# Patient Record
Sex: Female | Born: 1950 | Race: White | Hispanic: No | Marital: Married | Smoking: Never smoker
Health system: Southern US, Community
[De-identification: ages and names within clinical notes are randomized; demographics above are authoritative.]

## PROBLEM LIST (undated history)

## (undated) DIAGNOSIS — N200 Calculus of kidney: Secondary | ICD-10-CM

## (undated) DIAGNOSIS — K219 Gastro-esophageal reflux disease without esophagitis: Secondary | ICD-10-CM

## (undated) DIAGNOSIS — C44729 Squamous cell carcinoma of skin of left lower limb, including hip: Secondary | ICD-10-CM

## (undated) DIAGNOSIS — G4730 Sleep apnea, unspecified: Secondary | ICD-10-CM

## (undated) DIAGNOSIS — R112 Nausea with vomiting, unspecified: Secondary | ICD-10-CM

## (undated) DIAGNOSIS — Z87442 Personal history of urinary calculi: Secondary | ICD-10-CM

## (undated) DIAGNOSIS — H269 Unspecified cataract: Secondary | ICD-10-CM

## (undated) DIAGNOSIS — E785 Hyperlipidemia, unspecified: Secondary | ICD-10-CM

## (undated) DIAGNOSIS — K760 Fatty (change of) liver, not elsewhere classified: Secondary | ICD-10-CM

## (undated) DIAGNOSIS — E119 Type 2 diabetes mellitus without complications: Secondary | ICD-10-CM

## (undated) DIAGNOSIS — I700 Atherosclerosis of aorta: Secondary | ICD-10-CM

## (undated) DIAGNOSIS — M1990 Unspecified osteoarthritis, unspecified site: Secondary | ICD-10-CM

## (undated) DIAGNOSIS — D4709 Other mast cell neoplasms of uncertain behavior: Secondary | ICD-10-CM

## (undated) DIAGNOSIS — T7840XA Allergy, unspecified, initial encounter: Secondary | ICD-10-CM

## (undated) DIAGNOSIS — Z8719 Personal history of other diseases of the digestive system: Secondary | ICD-10-CM

## (undated) DIAGNOSIS — K5790 Diverticulosis of intestine, part unspecified, without perforation or abscess without bleeding: Secondary | ICD-10-CM

## (undated) DIAGNOSIS — K635 Polyp of colon: Secondary | ICD-10-CM

## (undated) DIAGNOSIS — G709 Myoneural disorder, unspecified: Secondary | ICD-10-CM

## (undated) DIAGNOSIS — K5792 Diverticulitis of intestine, part unspecified, without perforation or abscess without bleeding: Secondary | ICD-10-CM

## (undated) DIAGNOSIS — E669 Obesity, unspecified: Secondary | ICD-10-CM

## (undated) DIAGNOSIS — Z9889 Other specified postprocedural states: Secondary | ICD-10-CM

## (undated) HISTORY — PX: BREAST BIOPSY: SHX20

## (undated) HISTORY — PX: TONSILLECTOMY: SHX5217

## (undated) HISTORY — PX: BLADDER SURGERY: SHX569

## (undated) HISTORY — DX: Obesity, unspecified: E66.9

## (undated) HISTORY — DX: Type 2 diabetes mellitus without complications: E11.9

## (undated) HISTORY — DX: Sleep apnea, unspecified: G47.30

## (undated) HISTORY — DX: Calculus of kidney: N20.0

## (undated) HISTORY — DX: Polyp of colon: K63.5

## (undated) HISTORY — DX: Gastro-esophageal reflux disease without esophagitis: K21.9

## (undated) HISTORY — DX: Unspecified osteoarthritis, unspecified site: M19.90

## (undated) HISTORY — PX: OTHER SURGICAL HISTORY: SHX169

## (undated) HISTORY — DX: Other mast cell neoplasms of uncertain behavior: D47.09

## (undated) HISTORY — DX: Personal history of urinary calculi: Z87.442

## (undated) HISTORY — DX: Allergy, unspecified, initial encounter: T78.40XA

## (undated) HISTORY — DX: Atherosclerosis of aorta: I70.0

## (undated) HISTORY — DX: Diverticulitis of intestine, part unspecified, without perforation or abscess without bleeding: K57.92

## (undated) HISTORY — DX: Personal history of other diseases of the digestive system: Z87.19

## (undated) HISTORY — DX: Unspecified cataract: H26.9

## (undated) HISTORY — DX: Hyperlipidemia, unspecified: E78.5

## (undated) HISTORY — DX: Diverticulosis of intestine, part unspecified, without perforation or abscess without bleeding: K57.90

## (undated) HISTORY — PX: DIAGNOSTIC LAPAROSCOPY: SUR761

## (undated) HISTORY — DX: Squamous cell carcinoma of skin of left lower limb, including hip: C44.729

## (undated) HISTORY — DX: Fatty (change of) liver, not elsewhere classified: K76.0

## (undated) HISTORY — PX: SALPINGOOPHORECTOMY: SHX82

---

## 1998-02-20 ENCOUNTER — Other Ambulatory Visit: Admission: RE | Admit: 1998-02-20 | Discharge: 1998-02-20 | Payer: Self-pay | Admitting: Obstetrics and Gynecology

## 1998-02-25 ENCOUNTER — Other Ambulatory Visit: Admission: RE | Admit: 1998-02-25 | Discharge: 1998-02-25 | Payer: Self-pay | Admitting: Gynecology

## 1998-05-20 ENCOUNTER — Inpatient Hospital Stay (HOSPITAL_COMMUNITY): Admission: RE | Admit: 1998-05-20 | Discharge: 1998-05-23 | Payer: Self-pay | Admitting: Gynecology

## 1998-05-20 HISTORY — PX: TOTAL ABDOMINAL HYSTERECTOMY W/ BILATERAL SALPINGOOPHORECTOMY: SHX83

## 1998-07-23 ENCOUNTER — Ambulatory Visit (HOSPITAL_BASED_OUTPATIENT_CLINIC_OR_DEPARTMENT_OTHER): Admission: RE | Admit: 1998-07-23 | Discharge: 1998-07-23 | Payer: Self-pay | Admitting: Orthopedic Surgery

## 1999-03-31 ENCOUNTER — Encounter: Admission: RE | Admit: 1999-03-31 | Discharge: 1999-06-29 | Payer: Self-pay | Admitting: Obstetrics and Gynecology

## 2001-03-15 ENCOUNTER — Encounter: Admission: RE | Admit: 2001-03-15 | Discharge: 2001-03-15 | Payer: Self-pay | Admitting: Obstetrics and Gynecology

## 2001-03-15 ENCOUNTER — Encounter: Payer: Self-pay | Admitting: Obstetrics and Gynecology

## 2002-06-21 ENCOUNTER — Encounter: Payer: Self-pay | Admitting: Obstetrics and Gynecology

## 2002-06-21 ENCOUNTER — Encounter: Admission: RE | Admit: 2002-06-21 | Discharge: 2002-06-21 | Payer: Self-pay | Admitting: Obstetrics and Gynecology

## 2003-09-27 ENCOUNTER — Encounter: Admission: RE | Admit: 2003-09-27 | Discharge: 2003-09-27 | Payer: Self-pay | Admitting: Obstetrics and Gynecology

## 2004-10-10 ENCOUNTER — Encounter: Admission: RE | Admit: 2004-10-10 | Discharge: 2004-10-10 | Payer: Self-pay | Admitting: Obstetrics and Gynecology

## 2005-12-29 ENCOUNTER — Emergency Department (HOSPITAL_COMMUNITY): Admission: EM | Admit: 2005-12-29 | Discharge: 2005-12-30 | Payer: Self-pay | Admitting: Emergency Medicine

## 2006-01-14 ENCOUNTER — Encounter: Admission: RE | Admit: 2006-01-14 | Discharge: 2006-01-14 | Payer: Self-pay | Admitting: Obstetrics and Gynecology

## 2006-02-09 ENCOUNTER — Ambulatory Visit (HOSPITAL_COMMUNITY): Admission: RE | Admit: 2006-02-09 | Discharge: 2006-02-09 | Payer: Self-pay | Admitting: Cardiovascular Disease

## 2007-01-27 ENCOUNTER — Encounter: Admission: RE | Admit: 2007-01-27 | Discharge: 2007-01-27 | Payer: Self-pay | Admitting: Obstetrics and Gynecology

## 2008-01-31 ENCOUNTER — Encounter: Admission: RE | Admit: 2008-01-31 | Discharge: 2008-01-31 | Payer: Self-pay | Admitting: Obstetrics and Gynecology

## 2009-02-12 ENCOUNTER — Encounter: Admission: RE | Admit: 2009-02-12 | Discharge: 2009-02-12 | Payer: Self-pay | Admitting: Obstetrics and Gynecology

## 2009-02-22 ENCOUNTER — Ambulatory Visit (HOSPITAL_COMMUNITY): Admission: RE | Admit: 2009-02-22 | Discharge: 2009-02-22 | Payer: Self-pay | Admitting: Obstetrics and Gynecology

## 2009-12-10 ENCOUNTER — Encounter: Admission: RE | Admit: 2009-12-10 | Discharge: 2009-12-10 | Payer: Self-pay | Admitting: Endocrinology

## 2010-02-26 ENCOUNTER — Encounter: Admission: RE | Admit: 2010-02-26 | Discharge: 2010-02-26 | Payer: Self-pay | Admitting: Obstetrics and Gynecology

## 2010-05-14 ENCOUNTER — Encounter: Admission: RE | Admit: 2010-05-14 | Discharge: 2010-05-14 | Payer: Self-pay | Admitting: Gastroenterology

## 2010-09-03 ENCOUNTER — Encounter: Admission: RE | Admit: 2010-09-03 | Discharge: 2010-09-03 | Payer: Self-pay | Admitting: Gastroenterology

## 2010-10-26 ENCOUNTER — Encounter: Payer: Self-pay | Admitting: Endocrinology

## 2011-04-17 ENCOUNTER — Other Ambulatory Visit: Payer: Self-pay | Admitting: Obstetrics and Gynecology

## 2011-04-17 DIAGNOSIS — Z1231 Encounter for screening mammogram for malignant neoplasm of breast: Secondary | ICD-10-CM

## 2011-04-29 ENCOUNTER — Other Ambulatory Visit: Payer: Self-pay | Admitting: Endocrinology

## 2011-04-29 DIAGNOSIS — R599 Enlarged lymph nodes, unspecified: Secondary | ICD-10-CM

## 2011-04-30 ENCOUNTER — Other Ambulatory Visit: Payer: Self-pay

## 2011-05-05 ENCOUNTER — Ambulatory Visit: Payer: Self-pay

## 2011-05-06 ENCOUNTER — Ambulatory Visit
Admission: RE | Admit: 2011-05-06 | Discharge: 2011-05-06 | Disposition: A | Discharge status: Home / Self Care | Payer: PRIVATE HEALTH INSURANCE | Source: Ambulatory Visit | Attending: Endocrinology | Admitting: Endocrinology

## 2011-05-06 DIAGNOSIS — R599 Enlarged lymph nodes, unspecified: Secondary | ICD-10-CM

## 2013-06-29 ENCOUNTER — Encounter: Payer: Self-pay | Admitting: Cardiovascular Disease

## 2013-10-05 DIAGNOSIS — C44729 Squamous cell carcinoma of skin of left lower limb, including hip: Secondary | ICD-10-CM

## 2013-10-05 HISTORY — DX: Squamous cell carcinoma of skin of left lower limb, including hip: C44.729

## 2014-04-11 ENCOUNTER — Other Ambulatory Visit: Payer: Self-pay | Admitting: Obstetrics & Gynecology

## 2014-04-11 ENCOUNTER — Other Ambulatory Visit: Payer: Self-pay | Admitting: Obstetrics and Gynecology

## 2014-04-11 DIAGNOSIS — R1084 Generalized abdominal pain: Secondary | ICD-10-CM

## 2014-04-13 ENCOUNTER — Ambulatory Visit
Admission: RE | Admit: 2014-04-13 | Discharge: 2014-04-13 | Disposition: A | Discharge status: Home / Self Care | Payer: BC Managed Care – PPO | Source: Ambulatory Visit | Attending: Obstetrics & Gynecology | Admitting: Obstetrics & Gynecology

## 2014-04-13 DIAGNOSIS — R1084 Generalized abdominal pain: Secondary | ICD-10-CM

## 2014-04-13 MED ORDER — IOHEXOL 300 MG/ML  SOLN
100.0000 mL | Freq: Once | INTRAMUSCULAR | Status: AC | PRN
  Administered 2014-04-13: 100 mL via INTRAVENOUS

## 2014-05-07 ENCOUNTER — Other Ambulatory Visit: Payer: Self-pay | Admitting: Obstetrics and Gynecology

## 2014-07-12 ENCOUNTER — Telehealth: Payer: Self-pay | Admitting: Obstetrics and Gynecology

## 2014-07-12 NOTE — Telephone Encounter (Signed)
Call to pt to verify appt no answer

## 2014-07-18 ENCOUNTER — Ambulatory Visit (INDEPENDENT_AMBULATORY_CARE_PROVIDER_SITE_OTHER): Payer: BC Managed Care – PPO | Admitting: Obstetrics and Gynecology

## 2014-07-18 ENCOUNTER — Encounter: Payer: Self-pay | Admitting: Obstetrics and Gynecology

## 2014-07-18 VITALS — BP 120/66 | HR 68 | Resp 16 | Ht 65.0 in

## 2014-07-18 DIAGNOSIS — N393 Stress incontinence (female) (male): Secondary | ICD-10-CM

## 2014-07-18 DIAGNOSIS — Q822 Mastocytosis: Secondary | ICD-10-CM

## 2014-07-18 DIAGNOSIS — D4709 Other mast cell neoplasms of uncertain behavior: Secondary | ICD-10-CM

## 2014-07-18 DIAGNOSIS — Z01419 Encounter for gynecological examination (general) (routine) without abnormal findings: Secondary | ICD-10-CM

## 2014-07-18 DIAGNOSIS — N816 Rectocele: Secondary | ICD-10-CM

## 2014-07-18 MED ORDER — NYSTATIN-TRIAMCINOLONE 100000-0.1 UNIT/GM-% EX CREA: 1.0000 "application " | TOPICAL_CREAM | Freq: Two times a day (BID) | CUTANEOUS | Status: DC

## 2014-07-18 NOTE — Patient Instructions (Signed)

## 2014-07-18 NOTE — Progress Notes (Signed)
Patient ID: Kelly Glover, female   DOB: [birth year], 63 y.o.   MRN: [id number] 63 y.o. [id number] MarriedCaucasianF here for annual exam.   Has a rectocele.  If bladder is full and laughs, leaks urine.  Difficulty controlling gas with laughing.  No constipation.  No fecal soiling.  Ready for treatment.  Considering surgery for this year.   Has chronic yeast infections with diabetes.  Uses Monistat and Nystatin.  Takes Probiotics.  Status post TAH/USO.  Had pain and heavy menses.  Thinks she may have had an additional tightening procedure at the time of her surgery.  History of ectopic pregnancy and had tube and ovary removed.   On Estradiol 2.0 mg, takes one half tablet (1 mg daily). Has tried to wean off.  Did not take it today.  Used patch in the past and did not like it.   CT of abdomen and pelvis with contrast 04/13/14 - LLQ pain with bloating and cramping -  no acute findings, atherosclerotic disease, renal left renal calculi, multiple distal colonic diverticula.  Had an MRI. Subsequently diagnosed with diverticulitis.  Treated with antibiotics.  Has Augmentin and takes prn.   Diabetes mellitus.  Has an insulin pump and a transmitter also. Sees Dr. Forde Dandy.  No complications. No MI or stroke.   Has inappropriate mast cell activation syndrome.  Dr. Roswell Nickel. Episodes of sweating, diarrhea, vomiting, and hypotension that happen at night.  Treated with antihistamines. Last attack was 2 - 3 years ago.   No LMP recorded. Patient has had a hysterectomy.          Sexually active: Yes.    The current method of family planning is status post hysterectomy.    Exercising: Yes.    Gym/ health club routine includes personal trainer two times per week. Smoker:  no  Health Maintenance: Pap:  ?  History of abnormal Pap:  no MMG:  12/2013, normal per patient Colonoscopy:  2013, Dr. Earlean Shawl, normal, repeat in 5 years BMD:   02/26/10, T-Score: -0.9Spine/-1.6Left hip - osteopenia. Prompted by arm  and ankle fracture.   TDaP:  ?2012 Screening Labs: PCP, has copies with her today.  Glucose 112, WBC 11.5   reports that she has never smoked. She has never used smokeless tobacco. She reports that she does not drink alcohol or use illicit drugs.  Past Medical History  Diagnosis Date  . Diabetes   . History of kidney stones   . History of diverticulitis   . 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 Total abdominal hysterectomy w/ bilateral salpingoophorectomy  05/20/98  . Breast biopsy Left ?    benign  . Tonsillectomy  childhood    Current Outpatient Prescriptions  Medication Sig Dispense Refill  . aspirin EC 81 MG tablet Take 81 mg by mouth daily.      . cetirizine (ZYRTEC) 10 MG tablet Take 10 mg by mouth daily.      . Cholecalciferol (VITAMIN D) 2000 UNITS CAPS Take 1 capsule by mouth daily.      . CRESTOR 20 MG tablet Take 1 tablet by mouth daily.      Marland Kitchen estradiol (ESTRACE) 2 MG tablet Take 1 tablet by mouth daily.      . famotidine (PEPCID AC) 10 MG chewable tablet Chew 10 mg by mouth 2 (two) times daily.      . hyoscyamine (LEVSIN, ANASPAZ) 0.125 MG tablet Take 1 tablet  by mouth as needed.      Marland Kitchen JARDIANCE 10 MG TABS Take 1 tablet by mouth daily.      . metoprolol succinate (TOPROL-XL) 25 MG 24 hr tablet Take 1 tablet by mouth daily.      Marland Kitchen NOVOLOG 100 UNIT/ML injection Inject into the skin as directed.      . nystatin-triamcinolone (MYCOLOG II) cream Apply 1 application topically 2 (two) times daily.      Marland Kitchen omeprazole-sodium bicarbonate (ZEGERID) 40-1100 MG per capsule Take 1 capsule by mouth daily before breakfast.      . ONE TOUCH ULTRA TEST test strip       . ramipril (ALTACE) 2.5 MG capsule Take 1 capsule by mouth daily.      . sertraline (ZOLOFT) 100 MG tablet Take 1 tablet by mouth daily.      Marland Kitchen ZETIA 10 MG tablet Take 1 tablet by mouth daily.       No current facility-administered medications for this  visit.    Family History  Problem Relation Age of Onset  . Diabetes Mother   . Heart disease Mother   . Heart disease Father   . Thyroid disease Sister   . Asthma Maternal Grandmother 75  . Diabetes Maternal Grandmother   . Diabetes Maternal Grandfather   . Thyroid disease Son     Grave's Disease    ROS:  Pertinent items are noted in HPI.  Otherwise, a comprehensive ROS was negative.  Exam:   BP 120/66  Pulse 68  Resp 16  Ht 5' 5"  (1.651 m)     Height: 5' 5"  (165.1 cm)  Ht Readings from Last 3 Encounters:  07/18/14 5' 5"  (1.651 m)    General appearance: alert, cooperative and appears stated age Head: Normocephalic, without obvious abnormality, atraumatic Neck: no adenopathy, supple, symmetrical, trachea midline and thyroid normal to inspection and palpation Lungs: clear to auscultation bilaterally Breasts: normal appearance, no masses or tenderness, Inspection negative, No nipple retraction or dimpling, No nipple discharge or bleeding, No axillary or supraclavicular adenopathy Heart: regular rate and rhythm Abdomen: Insulin pump attached to skin, soft, non-tender; bowel sounds normal; no masses,  no organomegaly Extremities: extremities normal, atraumatic, no cyanosis or edema Skin: Skin color, texture, turgor normal. No rashes or lesions Lymph nodes: Cervical, supraclavicular, and axillary nodes normal. No abnormal inguinal nodes palpated Neurologic: Grossly normal   Pelvic: External genitalia:  no lesions.  Split in the akin of the perineum.              Urethra:  normal appearing urethra with no masses, tenderness or lesions              Bartholins and Skenes: normal                 Vagina: normal appearing vagina with normal color and discharge, no lesions, good anterior vaginal support with minimal prolapse, good apical support, second degree rectocele.              Cervix: anteverted              Pap taken: No. Bimanual Exam:  Uterus:  uterus absent               Adnexa: normal adnexa and no mass, fullness, tenderness               Rectovaginal: Confirms               Anus:  normal sphincter tone,  no lesions  Assessment: Well Woman Visit. Status post TAH/USO?[place] versus anterior colporrhaphy.  Status post prior USO for ectopic pregnancy.  Rectocele.  Incontinence of flatus. Stress incontinence by history.  ERT patient.  Recent diverticulitis.  Diabetes.   Chronic yeast infections.  Inappropriate mast cell activation syndrome.  Plan: Mammogram yearly.  Pap smear not indicated. Discussed ERT and potential benefits of using transdermal patch to reduce risk of thromboembolic events.  She states she has some patches at home and may try one. Rx for Mycolog II cream.  Comprehensive discussion about pelvic organ prolapse and genuine stress incontinence.  ACOG handouts on prolapse and incontinence in general and surgical treatment thereof. I discussed potential midurethral sling with permanent mesh and cystoscopy and posterior colporrhapy using native tissue repair.   We discussed benefits and risks of surgery which include but are not limited to bleeding, infection, damage to surrounding organs, ureteral damage, vaginal pain with intercourse, mesh erosion and exposure, urinary retention and need for prolonged catheterization and/or self catheterization, reoperation, recurrence of prolapse and incontinence. I have discussed surgical expectations regarding the procedures and success rates, outcomes, and recovery.     The patient will obtain records from her prior hysterectomy surgery.  She understands that she may need urodynamic testing performed in preparation for surgery.  She is uncertain at this time if she would like to proceed with a midurethral sling but is interested in the posterior colporrhaphy.  return annually or prn  An After Visit Summary was printed and given to the patient.  One hour face to face time of which over 50% was spent in  counseling.

## 2014-07-18 NOTE — Telephone Encounter (Signed)
Pt call to let sally know that 09/17/14 is okay for surgery.

## 2014-07-18 NOTE — Telephone Encounter (Signed)
Return call to patient. Patient talked with her husband and wants to proceed with rectocele repair on 09-17-14. Does not want to graft but states her husband (colleague of Dr Quincy Simmonds) with speak to Dr Quincy Simmonds personally and discuss options. He will also attend surgery consult appointment.  Advised patient we would like to get records from hysterectomy sent to our office. She will check on this. She will check her schedule and coordinate with husband's schedule and call back to proceed with scheduling of pre and post op appointments. Surgery is scheduled for 09-17-14 at 1100 at [facility]. Hospital notified patient will need pre anesthesia consult due to diabetes on surgical request. Chart to insurance to check surgical benefits.

## 2014-07-19 ENCOUNTER — Encounter: Payer: Self-pay | Admitting: Obstetrics and Gynecology

## 2014-07-19 DIAGNOSIS — N816 Rectocele: Secondary | ICD-10-CM | POA: Insufficient documentation

## 2014-07-19 DIAGNOSIS — N393 Stress incontinence (female) (male): Secondary | ICD-10-CM | POA: Insufficient documentation

## 2014-07-19 DIAGNOSIS — D4709 Other mast cell neoplasms of uncertain behavior: Secondary | ICD-10-CM | POA: Insufficient documentation

## 2014-07-20 ENCOUNTER — Telehealth: Payer: Self-pay | Admitting: Obstetrics and Gynecology

## 2014-07-20 NOTE — Telephone Encounter (Signed)
Phone call in follow up to office visit. Her husband will bring her operative report from her hysterectomy to the office here.  She is not interested in doing a midurethral sling.  Will await records from prior procedure and then make final with patient and then with husband conferring as well. Patient in agreement.

## 2014-08-03 ENCOUNTER — Ambulatory Visit (INDEPENDENT_AMBULATORY_CARE_PROVIDER_SITE_OTHER): Payer: BC Managed Care – PPO | Admitting: Internal Medicine

## 2014-08-03 ENCOUNTER — Encounter: Payer: Self-pay | Admitting: *Deleted

## 2014-08-03 VITALS — BP 124/68 | HR 50 | Ht 65.0 in

## 2014-08-03 DIAGNOSIS — E785 Hyperlipidemia, unspecified: Secondary | ICD-10-CM

## 2014-08-03 NOTE — Patient Instructions (Signed)
Your physician wants you to follow-up in: 1 year with Dr. Hattery.  You will receive a reminder letter in the mail two months in advance. If you don't receive a letter, please call our office to schedule the follow-up appointment.  

## 2014-08-03 NOTE — Telephone Encounter (Signed)
See next phone encounter from dr Quincy Simmonds regarding plan for surgery. [place] with insurance and billing has discussed insurance benefits with patient.  Routing to provider for final review. Patient agreeable to disposition. Will close encounter

## 2014-08-03 NOTE — Progress Notes (Signed)
HPI Kelly Glover is a 63 yo who was previously followed by Kelly Glover She has a history of DM, hyperlipidemia.  CT scan in past showed mild atherosclerotic plaquing of aorta. She has had intermittent chest pains in past.  Occasional palpitations  Short lived  Has had stress tests  (myoview 2013, neg), echo, and event monitors that have been negative.  She comes in today for continued cardiac care.  She follows with Kelly Glover  She notes rare CP spells  Not associated with actvitiy. Following her glucose more closely   Having problems with her L arm (torn biceps tendon) Allergies  Allergen Reactions  . Diflucan [Fluconazole]     Itching, palms red, throat itchy.   . Griseofulvin   . Doxycycline Itching and Rash    Itchy throat    Current Outpatient Prescriptions  Medication Sig Dispense Refill  . aspirin EC 81 MG tablet Take 81 mg by mouth daily.      . cetirizine (ZYRTEC) 10 MG tablet Take 10 mg by mouth daily.      . Cholecalciferol (VITAMIN D) 2000 UNITS CAPS Take 1 capsule by mouth daily.      . CRESTOR 20 MG tablet Take 1 tablet by mouth daily.      . famotidine (PEPCID AC) 10 MG chewable tablet Chew 10 mg by mouth 2 (two) times daily.      Marland Kitchen FOLIC ACID PO Take by mouth daily.      Marland Kitchen JARDIANCE 10 MG TABS Take 1 tablet by mouth daily.      . metoprolol succinate (TOPROL-XL) 25 MG 24 hr tablet Take 1 tablet by mouth as needed.       Marland Kitchen NOVOLOG 100 UNIT/ML injection Inject into the skin as directed.      Marland Kitchen omeprazole-sodium bicarbonate (ZEGERID) 40-1100 MG per capsule Take 1 capsule by mouth daily before breakfast.      . ONE TOUCH ULTRA TEST test strip       . ramipril (ALTACE) 2.5 MG capsule Take 1 capsule by mouth daily.      . sertraline (ZOLOFT) 100 MG tablet Take 1 tablet by mouth daily.      Marland Kitchen ZETIA 10 MG tablet Take 1 tablet by mouth daily.       No current facility-administered medications for this visit.    Past Medical History  Diagnosis Date  . Diabetes   . History of  kidney stones   . History of diverticulitis   . 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 Total abdominal hysterectomy w/ bilateral salpingoophorectomy  05/20/98  . Breast biopsy Left ?    benign  . Tonsillectomy  childhood    Family History  Problem Relation Age of Onset  . Diabetes Mother   . Heart disease Mother   . Heart disease Father   . Thyroid disease Sister   . Asthma Maternal Grandmother 66  . Diabetes Maternal Grandmother   . Diabetes Maternal Grandfather   . Graves' disease Son   . Breast cancer Maternal Grandmother   . Thyroid disease Son     History   Social History  . Marital Status: Married    Spouse Name: N/A    Number of Children: N/A  . Years of Education: N/A   Occupational History  . Not on file.   Social History Main  Topics  . Smoking status: Never Smoker   . Smokeless tobacco: Never Used  . Alcohol Use: No  . Drug Use: No  . Sexual Activity: Yes    Partners: Male    Birth Control/ Protection: Surgical     Comment: hysterectomy   Other Topics Concern  . Not on file   Social History Narrative  . No narrative on file    Review of Systems:  All systems reviewed.  They are negative to the above problem except as previously stated.  Vital Signs: BP 124/68  Pulse 50  Ht 5' 5"  (1.651 m)  Physical Exam   HEENT:  Normocephalic, atraumatic. EOMI, PERRLA.  Neck: JVP is normal.  No bruits.  Lungs: clear to auscultation. No rales no wheezes.  Heart: Regular rate and rhythm. Normal S1, S2. No S3.   No significant murmurs. PMI not displaced.  Abdomen:  Supple, nontender. Normal bowel sounds. No masses. No hepatomegaly.  Extremities:   Good distal pulses throughout. No lower extremity edema.  Musculoskeletal :moving all extremities.  Neuro:   alert and oriented x3.  CN II-XII grossly intact.  EKG   SB 50     Assessment and Plan:  1.  HL  Last lipids LDL was 55, HDL 69  Trig 83  Says her LDL signif improved when she added Zetia  Would continue Watch diet. Try to lose wt.   Stay on this dose with mild plaquing of aorta.

## 2014-08-03 NOTE — Telephone Encounter (Signed)
OKay to close?

## 2014-08-06 ENCOUNTER — Encounter: Payer: Self-pay | Admitting: *Deleted

## 2014-08-07 NOTE — Telephone Encounter (Signed)
Left message for patient to call back. Need to go over benefits for urodynamics and surgery. Urodynamics =$40 copay Surgeon=$200 copay

## 2014-08-08 ENCOUNTER — Encounter: Payer: Self-pay | Admitting: Internal Medicine

## 2014-08-08 ENCOUNTER — Encounter: Payer: Self-pay | Admitting: Cardiovascular Disease

## 2014-08-10 ENCOUNTER — Telehealth: Payer: Self-pay | Admitting: Emergency Medicine

## 2014-08-10 ENCOUNTER — Telehealth: Payer: Self-pay | Admitting: Obstetrics and Gynecology

## 2014-08-10 NOTE — Telephone Encounter (Signed)
Encounter opened erroneously.   Closed encounter.

## 2014-08-10 NOTE — Telephone Encounter (Signed)
Phone call to discuss bladder care and potential bladder surgery at the time of the rectocele repair.  Patient has had a prior anterior colporrhaphy in 1999 when she had an LAVH/BSO/LOA.  Patient has leakage of urine if bladder is full and she coughs, laughs, or sneezes. She is uncertain if she wishes to proceed with a midurethral sling.   I discussed and recommended urodynamic testing to further evaluate her stress incontinence.  Patient agrees with this plan.   Will precert urodynamics and add possible TVT and cystoscopy to her surgery for December 2015.

## 2014-08-10 NOTE — Telephone Encounter (Signed)
Spoke with patient. Advised that per benefit quote received she will be responsible for a $40 copay when she comes in for urodynamics. Also advised that there will be associated pathology charges, but that because they are billed by the lab. I am unable to quote those costs. Patient agreeable. Scheduled urinalysis 11.13 and urodynamics 11.18.  Also advised patient that per benefit quote received, she will be responsible for a $200 copay for the surgeons portion of her surgery. Advised that she will receive separate correspondence from the hospital regarding theirs fees/billing. Patient agreeable.    Gay Filler, Please enter urodynamics order.  Thanks

## 2014-08-20 ENCOUNTER — Ambulatory Visit (INDEPENDENT_AMBULATORY_CARE_PROVIDER_SITE_OTHER): Payer: BC Managed Care – PPO

## 2014-08-20 ENCOUNTER — Telehealth: Payer: Self-pay | Admitting: Obstetrics and Gynecology

## 2014-08-20 VITALS — BP 132/70 | HR 72

## 2014-08-20 DIAGNOSIS — N393 Stress incontinence (female) (male): Secondary | ICD-10-CM

## 2014-08-20 LAB — POCT URINALYSIS DIPSTICK
Bilirubin, UA: NEGATIVE
KETONES UA: NEGATIVE
Leukocytes, UA: NEGATIVE
Nitrite, UA: NEGATIVE
Protein, UA: NEGATIVE
RBC UA: NEGATIVE
UROBILINOGEN UA: NEGATIVE
pH, UA: 6

## 2014-08-20 NOTE — Progress Notes (Signed)
Pt came here for UA dipstick before her urodynamics Appt. Pt refused to have weight and height taken. The pt states no symptoms of any kind. The UA showed 1014m of glucose with a ph of 6.0. Routing to provider for final review.

## 2014-08-20 NOTE — Telephone Encounter (Signed)
MAILED SURGERY PAYMENT LETTER  August 20, 2014   Dear Mrs. Kelly Glover,  Your surgery is scheduled for September 17, 2014.  Upon requesting authorization for your surgery, your insurance company has informed us that they will cover 100% of the charges after a $200.00 copay, and you will be responsible to pay approximately $200.00  It is our office policy that this amount is paid in full two weeks prior to your surgery. Your payment is due September 03, 2014.  If there is a balance due after your insurance company pays their portion, we will send you a bill.  If there is a refund due to you, we will send you a check within one month.  Payment may be made by cash, check, Visa, MasterCard or [employer] Express.  If payment is not made two weeks prior to your surgery, we will have to reschedule your surgery.  The above fee includes only our fee for the surgery and does not include charges you may have from the facility, anesthesia or pathology.  If you have any questions, please call us at [phone].

## 2014-08-21 NOTE — Progress Notes (Signed)
Encounter reviewed by Dr. Josefa Half.  OK to proceed with urodynamic testing.

## 2014-08-22 ENCOUNTER — Ambulatory Visit (INDEPENDENT_AMBULATORY_CARE_PROVIDER_SITE_OTHER): Payer: BC Managed Care – PPO | Admitting: Obstetrics and Gynecology

## 2014-08-22 VITALS — BP 133/78 | HR 76 | Resp 12

## 2014-08-22 DIAGNOSIS — N816 Rectocele: Secondary | ICD-10-CM

## 2014-08-22 DIAGNOSIS — N393 Stress incontinence (female) (male): Secondary | ICD-10-CM

## 2014-08-22 NOTE — Telephone Encounter (Signed)
Patient is office today for urodynamic testing. Surgery date of 09-17-14 at 0730 confirmed. Surgery instruction sheet reviewed and printed copy given to patient. Pre and post-surgical appointments scheduled.Consult to discuss results urodynamics results scheduled with surgical consult for 09-05-14. She is advised to discontinue ASA and NSAIDS 2 weeks prior to surgery. Fleet enema evening before surgery unless otherwise directed by Dr Quincy Simmonds at surgery consult.  Anything else needed before surgery consult?

## 2014-08-22 NOTE — Telephone Encounter (Signed)
Patient will need anesthesia consultation.  She has diabetes and uses an insulin pump.   Was recently seen by her cardiologist, so I do not think that that is necessary as another preop visit.

## 2014-08-22 NOTE — Patient Instructions (Signed)
.   You may have a mild bladder and rectal discomfort for a few hours after the test. . You may experience some frequent urination and slight burning the first few times you urinate after the test. Rarely, the urine may be blood tinged. These are both due to catheter placements and resolve quickly.  . You should call our office immediately if you have signs of infection, which may include bladder pain, urinary urgency, fever, or burning during urination. .  We do encourage you to drink plenty of water after completion of the test today.

## 2014-08-23 NOTE — Telephone Encounter (Signed)
Anesthesia consult was requested when case was submitted. Call to [facility] PAT dept, spoke to Arbie Cookey to confirm patient would have anesthesia consult at pre-op appointment.  Encounter closed.

## 2014-08-23 NOTE — Progress Notes (Signed)
Late Entry for 08/22/14 1100:   Kelly Glover is a 63 y.o. female Who presents today for urodynamics testings. Allergies and medications reviewed. Verbal consent obtained. Denies complaints today. No urinary complaints.  Urine Micro exam: negative for WBC's or RBC's, okay to proceed per Dr. Quincy Simmonds. Patient reports urinary leakage with coughing, sneezing, exercise, and heavy lifting. Also feels she leaks with very full bladder.  Post void residual 0 ml.  Urodynamics testing initiated with urethral and  Rectal catheter placed without issue.  Urodynamics testing completed. Please see scanned Patient summary report in Epic. Procedure completed and patient tolerated well without complaints.   Patient given post procedure instructions:  You may have a mild bladder and rectal discomfort for a few hours after the test. You may experience some frequent urination and slight burning the first few times you urinate after the test. Rarely, the urine may be blood tinged. These are both due to catheter placements and resolve quickly. You should call our office immediately if you have signs of infection, which may include bladder pain, urinary urgency, fever, or burning during urination. We do encourage you to drink plenty of water after the test. Patient verbalized understanding of instructions and follow up scheduled.

## 2014-08-24 ENCOUNTER — Encounter: Payer: Self-pay | Admitting: Obstetrics and Gynecology

## 2014-08-24 NOTE — Progress Notes (Signed)
Encounter reviewed by Dr. Josefa Half.  Multichannel urodynamic testing   Uroflow - Void 58 cc.  PVR 0 CMG - S1 - 444cc, S2 616 cc, Max capacity 745.7 cc.  Stable CMG.  No LPP.  UPP - 26 cm H2O. Pressure flow - Pdet max 40 cm H2O.  Voided 805 cc.  No evidence of genuine stress incontinence.  Large bladder volume.

## 2014-09-05 ENCOUNTER — Ambulatory Visit (INDEPENDENT_AMBULATORY_CARE_PROVIDER_SITE_OTHER): Payer: BC Managed Care – PPO | Admitting: Obstetrics and Gynecology

## 2014-09-05 ENCOUNTER — Encounter: Payer: Self-pay | Admitting: Obstetrics and Gynecology

## 2014-09-05 ENCOUNTER — Telehealth: Payer: Self-pay | Admitting: *Deleted

## 2014-09-05 VITALS — BP 140/60 | HR 72 | Temp 98.7°F | Resp 18 | Ht 65.0 in

## 2014-09-05 DIAGNOSIS — N816 Rectocele: Secondary | ICD-10-CM

## 2014-09-05 MED ORDER — OXYCODONE HCL 5 MG PO TABS: 5.0000 mg | ORAL_TABLET | ORAL | Status: DC | PRN

## 2014-09-05 MED ORDER — TERCONAZOLE 0.4 % VA CREA: 1.0000 | TOPICAL_CREAM | Freq: Every day | VAGINAL | Status: DC

## 2014-09-05 MED ORDER — IBUPROFEN 800 MG PO TABS: 800.0000 mg | ORAL_TABLET | Freq: Three times a day (TID) | ORAL | Status: DC | PRN

## 2014-09-05 MED ORDER — ONDANSETRON HCL 4 MG PO TABS: 4.0000 mg | ORAL_TABLET | Freq: Three times a day (TID) | ORAL | Status: DC | PRN

## 2014-09-05 NOTE — Patient Instructions (Signed)
Please do an enema the night before surgery.

## 2014-09-05 NOTE — Progress Notes (Signed)
GYNECOLOGY  VISIT   HPI: 63 y.o.   Married  Caucasian  female   [phone] with No LMP recorded. Patient has had a hysterectomy.   here for Pre Op for rectocele repair.  Patient's husband, Dr. Gus Height, is with patient today for the discussion portion of her visit.   Has a symptomatic rectocele and loss of control of gas when she laughs. Rare leakage of urine with valsalva type maneuver.  Had urodynamic testing and did not demonstrate genuine stress incontinence.  Patient wishing to avoid midurethral sling.  Is status post anterior colporrhaphy at time of her laparoscopically assisted vaginal hysterectomy.   No change in medical status.  Has an insulin pump and a glucose recorder.  Deals with chronic vulvar candida. Does monthly Gynezole-1.  Saw Dr. Dorris Carnes for routine cardiology visit.  No changes in her care were made.  She is starting a weight loss program.  GYNECOLOGIC HISTORY: No LMP recorded. Patient has had a hysterectomy. Contraception: Hysterectomy   Menopausal hormone therapy: occ Estradiol        OB History    Gravida Para Term Preterm AB TAB SAB Ectopic Multiple Living   4 2 2  2  1 1  2          Patient Active Problem List   Diagnosis Date Noted  . Rectocele 07/19/2014  . Genuine stress incontinence, female 07/19/2014  . Mast cell disorder     Past Medical History  Diagnosis Date  . Diabetes   . History of kidney stones   . History of diverticulitis   . 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 Total abdominal hysterectomy w/ bilateral salpingoophorectomy  05/20/98  . Breast biopsy Left ?    benign  . Tonsillectomy  childhood    Current Outpatient Prescriptions  Medication Sig Dispense Refill  . aspirin EC 81 MG tablet Take 81 mg by mouth daily.    . cetirizine (ZYRTEC) 10 MG tablet Take 10 mg by mouth daily.    .  Cholecalciferol (VITAMIN D) 2000 UNITS CAPS Take 1 capsule by mouth daily.    . CRESTOR 20 MG tablet Take 1 tablet by mouth daily.    Marland Kitchen estradiol (ESTRACE) 2 MG tablet   0  . famotidine (PEPCID AC) 10 MG chewable tablet Chew 10 mg by mouth daily.     Marland Kitchen FOLIC ACID PO Take 726 mcg by mouth daily.     Marland Kitchen JARDIANCE 10 MG TABS Take 1 tablet by mouth daily.    Marland Kitchen NOVOLOG 100 UNIT/ML injection Inject into the skin as directed. Insulin pump    . nystatin-triamcinolone (MYCOLOG II) cream Apply topically 2 (two) times daily.  0  . omeprazole-sodium bicarbonate (ZEGERID) 40-1100 MG per capsule Take 1 capsule by mouth daily before breakfast.    . ONE TOUCH ULTRA TEST test strip     . ramipril (ALTACE) 2.5 MG capsule Take 1 capsule by mouth daily.    . sertraline (ZOLOFT) 100 MG tablet Take 1 tablet by mouth daily.    Marland Kitchen ZETIA 10 MG tablet Take 1 tablet by mouth daily.     No current facility-administered medications for this visit.     ALLERGIES: Diflucan; Griseofulvin; and Doxycycline  Family History  Problem Relation Age of Onset  . Diabetes Mother   . Heart disease Mother   .  Heart disease Father   . Thyroid disease Sister   . Asthma Maternal Grandmother 29  . Diabetes Maternal Grandmother   . Diabetes Maternal Grandfather   . Graves' disease Son   . Breast cancer Maternal Grandmother   . Thyroid disease Son     History   Social History  . Marital Status: Married    Spouse Name: N/A    Number of Children: N/A  . Years of Education: N/A   Occupational History  . Not on file.   Social History Main Topics  . Smoking status: Never Smoker   . Smokeless tobacco: Never Used  . Alcohol Use: No  . Drug Use: No  . Sexual Activity:    Partners: Male    Birth Control/ Protection: Surgical     Comment: hysterectomy   Other Topics Concern  . Not on file   Social History Narrative    ROS:  Pertinent items are noted in HPI.  PHYSICAL EXAMINATION:    BP 140/60 mmHg  Pulse 72   Temp(Src) 98.7 F (37.1 C) (Oral)  Resp 18  Ht 5' 5"  (1.651 m)     General appearance: alert, cooperative and appears stated age Lungs: clear to auscultation bilaterally Heart: regular rate and rhythm Abdomen: soft, non-tender; no masses,  no organomegaly No abnormal inguinal nodes palpated  Pelvic: External genitalia:  Bilateral labia with generalized erythema.  No specific lesions.               Urethra:  normal appearing urethra with no masses, tenderness or lesions              Bartholins and Skenes: normal                 Vagina: normal appearing vagina with normal color and discharge, no lesions, second degree rectocele, good apical and anterior vaginal wall support.               Cervix: absent                   Bimanual Exam:  Uterus:  Absent.                                      Adnexa: normal adnexa in size, nontender and no masses                                      Rectovaginal:  Yes.                                                                Confirms                                      Anus:  normal sphincter tone, no lesions  ASSESSMENT  Symptomatic rectocele. Chronic candida of the vulva.  Diabetes mellitus.  History of inappropriate mast cell activation disorder.  PLAN  Proceed with rectocele repair using native tissue.  Risks, benefits, and alternatives discussed with the patient who wishes to proceed.  May discharge to home from PACU. Will treat with Terazol 7.  Anesthesia consult scheduled for [facility]. Patient received Rx for oxycodone, Motrin, and Zofran today.    An After Visit Summary was printed and given to the patient.  __30____ minutes face to face time of which over 50% was spent in counseling.

## 2014-09-05 NOTE — Telephone Encounter (Signed)
-----   Message from [place], MD sent at 09/05/2014  2:43 PM EST ----- Regarding: needs anesthesia consult when she has her preop at hospital next week Gay Filler,   Please let PAT know that the patient needs anesthesia consultation at her preop visit due to her diabetes and history of inappropriate mast cell response disorder.   She has an appointment at hospital on 09/11/14.   Thanks,   [employer]

## 2014-09-05 NOTE — Telephone Encounter (Addendum)
Call to [facility], spoke with Arbie Cookey, advised of need for anesthesia consultation at preop due to diabetes and history of inappropriate mast cell response disorder as Dr Quincy Simmonds ordered..  Per Arbie Cookey, extra time has been blocked for appointment and she is scheduled to see anesthesia.  Routing to provider for final review. Patient agreeable to disposition. Will close encounter

## 2014-09-06 ENCOUNTER — Encounter (HOSPITAL_COMMUNITY): Payer: Self-pay | Admitting: *Deleted

## 2014-09-06 NOTE — Pre-Procedure Instructions (Signed)
Gay Filler from MD's office called to advise [place] that patient has DM and inappropriate mast cell response disorder per Dr. Quincy Simmonds.

## 2014-09-11 ENCOUNTER — Encounter (HOSPITAL_COMMUNITY): Payer: Self-pay

## 2014-09-11 ENCOUNTER — Telehealth: Payer: Self-pay | Admitting: Obstetrics and Gynecology

## 2014-09-11 ENCOUNTER — Encounter (HOSPITAL_COMMUNITY)
Admission: RE | Admit: 2014-09-11 | Discharge: 2014-09-11 | Disposition: A | Discharge status: Home / Self Care | Payer: BC Managed Care – PPO | Source: Ambulatory Visit | Attending: Obstetrics and Gynecology | Admitting: Obstetrics and Gynecology

## 2014-09-11 DIAGNOSIS — Z01812 Encounter for preprocedural laboratory examination: Secondary | ICD-10-CM | POA: Insufficient documentation

## 2014-09-11 HISTORY — DX: Other specified postprocedural states: R11.2

## 2014-09-11 HISTORY — DX: Other specified postprocedural states: Z98.890

## 2014-09-11 LAB — CBC
HEMATOCRIT: 43.3 % (ref 36.0–46.0)
Hemoglobin: 14.8 g/dL (ref 12.0–15.0)
MCH: 31.3 pg (ref 26.0–34.0)
MCHC: 34.2 g/dL (ref 30.0–36.0)
MCV: 91.5 fL (ref 78.0–100.0)
Platelets: 227 10*3/uL (ref 150–400)
RBC: 4.73 MIL/uL (ref 3.87–5.11)
RDW: 12.4 % (ref 11.5–15.5)
WBC: 7.3 10*3/uL (ref 4.0–10.5)

## 2014-09-11 LAB — BASIC METABOLIC PANEL
Anion gap: 11 (ref 5–15)
BUN: 16 mg/dL (ref 6–23)
CO2: 28 mEq/L (ref 19–32)
CREATININE: 0.6 mg/dL (ref 0.50–1.10)
Calcium: 10.1 mg/dL (ref 8.4–10.5)
Chloride: 104 mEq/L (ref 96–112)
GFR calc Af Amer: >90 mL/min (ref >90)
GFR calc non Af Amer: >90 mL/min (ref >90)
Glucose, Bld: 192 mg/dL — ABNORMAL HIGH (ref 70–99)
Potassium: 4.7 mEq/L (ref 3.7–5.3)
Sodium: 143 mEq/L (ref 137–147)

## 2014-09-11 NOTE — Patient Instructions (Addendum)
Your procedure is scheduled on 09/17/14  Enter through the Main Entrance at : 6am  Pick up desk phone and dial [phone] and inform us of your arrival.  Please call [phone] if you have any problems the morning of surgery.  Remember: Do not eat food or drink liquids, including water, after midnight: Sunday   You may brush your teeth the morning of surgery.  Take these meds the morning of surgery with a sip of water:none  DO NOT wear jewelry, eye make-up, lipstick,body lotion, or dark fingernail polish.  (Polished toes are ok) You may wear deodorant.  If you are to be admitted after surgery, leave suitcase in car until your room has been assigned. Patients discharged on the day of surgery will not be allowed to drive home. Wear loose fitting, comfortable clothes for your ride home.

## 2014-09-11 NOTE — Anesthesia Preprocedure Evaluation (Addendum)
Anesthesia Evaluation  Patient identified by MRN, date of birth, ID band Patient awake    Reviewed: Allergy & Precautions, H&P , Patient's Chart, lab work & pertinent test results  History of Anesthesia Complications (+) PONV and history of anesthetic complications  Airway Mallampati: II  TM Distance: >3 FB Neck ROM: full    Dental no notable dental hx.    Pulmonary neg pulmonary ROS,  breath sounds clear to auscultation  Pulmonary exam normal       Cardiovascular Exercise Tolerance: Good negative cardio ROS  Rhythm:regular Rate:Normal     Neuro/Psych  Neuromuscular disease (Arm tendon tear) negative psych ROS   GI/Hepatic negative GI ROS, Neg liver ROS,   Endo/Other  diabetes, Well Controlled, Type 2, Insulin Dependent  Renal/GU negative Renal ROS Bladder dysfunction  Genuine SUI Cystocele    Musculoskeletal negative musculoskeletal ROS (+)   Abdominal   Peds  Hematology negative hematology ROS (+)   Anesthesia Other Findings Has inappropriate Mast cell activation; Has flushing and diarrhea, with potential to need epi-pen.  Attacks rare. Requested spinal I cautioned her that PF-MSO4 would not be likely if she plans to go home on the same day.  Cut insulin pump back to half it's infusion rate. Check am BS  Reproductive/Obstetrics negative OB ROS                            Anesthesia Physical Anesthesia Plan  ASA: III  Anesthesia Plan: Spinal   Post-op Pain Management:    Induction:   Airway Management Planned:   Additional Equipment:   Intra-op Plan:   Post-operative Plan:   Informed Consent: I have reviewed the patients History and Physical, chart, labs and discussed the procedure including the risks, benefits and alternatives for the proposed anesthesia with the patient or authorized representative who has indicated his/her understanding and acceptance.   Dental  Advisory Given  Plan Discussed with: CRNA, Anesthesiologist and Surgeon  Anesthesia Plan Comments: (Lab work confirmed with CRNA in room. Platelets okay. Discussed spinal anesthetic, and patient consents to the procedure:  included risk of possible headache,backache, failed block, allergic reaction, and nerve injury. This patient was asked if she had any questions or concerns before the procedure started. )       Anesthesia Quick Evaluation

## 2014-09-11 NOTE — Telephone Encounter (Signed)
Patient's pharmacy calling stating they received a fax for Oxycodone and another for ibuprofen. He say the oxycodone cannot be sent by fax. Please advise?

## 2014-09-11 NOTE — Telephone Encounter (Signed)
Spoke with Pharmacist at [facility] who states that patient will need to bring in hard copy of prescription for Oxycodone to have it filled. Patient was provided hard copy on 12/02 for prescriptions needed before surgery from [place]. See OV note below.   PLAN  Proceed with rectocele repair using native tissue. Risks, benefits, and alternatives discussed with the patient who wishes to proceed. May discharge to home from PACU. Will treat with Terazol 7.  Anesthesia consult scheduled for [facility]. Patient received Rx for oxycodone, Motrin, and Zofran today.   An After Visit Summary was printed and given to the patient.  __30____ minutes face to face time of which over 50% was spent in counseling.   Routing to provider for final review. Patient agreeable to disposition. Will close encounter

## 2014-09-12 NOTE — Telephone Encounter (Signed)
Spoke with patient. Patient states that her husband faxed the prescriptions and she is not sure where he placed them. Patient will speak with husband and return call if she needs another hard copy of the Percocet rx.

## 2014-09-12 NOTE — Telephone Encounter (Signed)
Let me know if I need to provide another hard copy of the Percocet Rx for the patient.

## 2014-09-16 MED ORDER — DEXTROSE 5 % IV SOLN
2.0000 g | INTRAVENOUS | Status: AC
  Administered 2014-09-17: 2 g via INTRAVENOUS
  Filled 2014-09-16: qty 2

## 2014-09-16 NOTE — H&P (Signed)
Kelly E Amundson de Berton Lan, MD at 09/05/2014 12:36 PM       Status: Signed        Expand All Collapse All   GYNECOLOGY VISIT   HPI: 63 y.o.   Married  Caucasian  female    [phone] with No LMP recorded. Patient has had a hysterectomy.   here for Pre Op for rectocele repair.   Patient's husband, Dr. Gus Height, is with patient today for the discussion portion of her visit.   Has a symptomatic rectocele and loss of control of gas when she laughs. Rare leakage of urine with valsalva type maneuver.   Had urodynamic testing and did not demonstrate genuine stress incontinence.   Patient wishing to avoid midurethral sling.   Is status post anterior colporrhaphy at time of her laparoscopically assisted vaginal hysterectomy.   No change in medical status.   Has an insulin pump and a glucose recorder.  Deals with chronic vulvar candida. Does monthly Gynezole-1.  Saw Dr. Dorris Carnes for routine cardiology visit.  No changes in her care were made.   She is starting a weight loss program.  GYNECOLOGIC HISTORY: No LMP recorded. Patient has had a hysterectomy. Contraception: Hysterectomy    Menopausal hormone therapy: occ Estradiol         OB History      Gravida  Para  Term  Preterm  AB  TAB  SAB  Ectopic  Multiple  Living     4  2  2    2    1  1    2              Patient Active Problem List     Diagnosis  Date Noted   .  Rectocele  07/19/2014   .  Genuine stress incontinence, female  07/19/2014   .  Mast cell disorder         Past Medical History   Diagnosis  Date   .  Diabetes     .  History of kidney stones     .  History of diverticulitis     .  Squamous cell carcinoma of left lower leg  2015   .  Mast cell disorder         inappropriate mast cell activation       Past Surgical History   Procedure  Laterality  Date   .  Total abdominal hysterectomy w/ bilateral salpingoophorectomy    05/20/98   .  Breast biopsy  Left  ?       benign   .  Tonsillectomy     childhood       Current Outpatient Prescriptions   Medication  Sig  Dispense  Refill   .  aspirin EC 81 MG tablet  Take 81 mg by mouth daily.       .  cetirizine (ZYRTEC) 10 MG tablet  Take 10 mg by mouth daily.       .  Cholecalciferol (VITAMIN D) 2000 UNITS CAPS  Take 1 capsule by mouth daily.       .  CRESTOR 20 MG tablet  Take 1 tablet by mouth daily.       Marland Kitchen  estradiol (ESTRACE) 2 MG tablet      0   .  famotidine (PEPCID AC) 10 MG chewable tablet  Chew 10 mg by mouth daily.        Marland Kitchen  FOLIC ACID PO  Take 454  mcg by mouth daily.        Marland Kitchen  JARDIANCE 10 MG TABS  Take 1 tablet by mouth daily.       Marland Kitchen  NOVOLOG 100 UNIT/ML injection  Inject into the skin as directed. Insulin pump       .  nystatin-triamcinolone (MYCOLOG II) cream  Apply topically 2 (two) times daily.    0   .  omeprazole-sodium bicarbonate (ZEGERID) 40-1100 MG per capsule  Take 1 capsule by mouth daily before breakfast.       .  ONE TOUCH ULTRA TEST test strip         .  ramipril (ALTACE) 2.5 MG capsule  Take 1 capsule by mouth daily.       .  sertraline (ZOLOFT) 100 MG tablet  Take 1 tablet by mouth daily.       Marland Kitchen  ZETIA 10 MG tablet  Take 1 tablet by mouth daily.          No current facility-administered medications for this visit.      ALLERGIES: Diflucan; Griseofulvin; and Doxycycline    Family History   Problem  Relation  Age of Onset   .  Diabetes  Mother     .  Heart disease  Mother     .  Heart disease  Father     .  Thyroid disease  Sister     .  Asthma  Maternal Grandmother  56   .  Diabetes  Maternal Grandmother     .  Diabetes  Maternal Grandfather     .  Graves' disease  Son     .  Breast cancer  Maternal Grandmother     .  Thyroid disease  Son         History      Social History   .  Marital Status:  Married       Spouse Name:  N/A       Number of Children:  N/A   .  Years of Education:  N/A      Occupational History   .  Not on file.      Social History Main Topics   .  Smoking status:   Never Smoker    .  Smokeless tobacco:  Never Used   .  Alcohol Use:  No   .  Drug Use:  No   .  Sexual Activity:       Partners:  Male       Birth Control/ Protection:  Surgical         Comment: hysterectomy      Other Topics  Concern   .  Not on file      Social History Narrative     ROS:  Pertinent items are noted in HPI.  PHYSICAL EXAMINATION:    BP 140/60 mmHg  Pulse 72  Temp(Src) 98.7 F (37.1 C) (Oral)  Resp 18  Ht 5' 5"  (1.651 m)     General appearance: alert, cooperative and appears stated age Lungs: clear to auscultation bilaterally Heart: regular rate and rhythm Abdomen: soft, non-tender; no masses,  no organomegaly No abnormal inguinal nodes palpated  Pelvic: External genitalia:  Bilateral labia with generalized erythema.  No specific lesions.                Urethra:  normal appearing urethra with no masses, tenderness or lesions  Bartholins and Skenes: normal                  Vagina: normal appearing vagina with normal color and discharge, no lesions, second degree rectocele, good apical and anterior vaginal wall support.                Cervix: absent                    Bimanual Exam:  Uterus:  Absent.                                      Adnexa: normal adnexa in size, nontender and no masses                                      Rectovaginal:  Yes.                                                                Confirms                                      Anus:  normal sphincter tone, no lesions  ASSESSMENT  Symptomatic rectocele. Chronic candida of the vulva.   Diabetes mellitus.   History of inappropriate mast cell activation disorder.  PLAN  Proceed with rectocele repair using native tissue.  Risks, benefits, and alternatives discussed with the patient who wishes to proceed.  May discharge to home from PACU. Will treat with Terazol 7.   Anesthesia consult scheduled for [facility]. Patient received Rx for oxycodone, Motrin, and  Zofran today.      An After Visit Summary was printed and given to the patient.  __30____ minutes face to face time of which over 50% was spent in counseling.

## 2014-09-17 ENCOUNTER — Encounter (HOSPITAL_COMMUNITY): Payer: Self-pay | Admitting: *Deleted

## 2014-09-17 ENCOUNTER — Ambulatory Visit (HOSPITAL_COMMUNITY): Payer: BC Managed Care – PPO | Admitting: Anesthesiology

## 2014-09-17 ENCOUNTER — Ambulatory Visit (HOSPITAL_COMMUNITY)
Admission: RE | Admit: 2014-09-17 | Discharge: 2014-09-17 | Disposition: A | Discharge status: Home / Self Care | Payer: BC Managed Care – PPO | Source: Ambulatory Visit | Attending: Obstetrics and Gynecology | Admitting: Obstetrics and Gynecology

## 2014-09-17 ENCOUNTER — Encounter (HOSPITAL_COMMUNITY)
Admission: RE | Disposition: A | Discharge status: Home / Self Care | Payer: Self-pay | Source: Ambulatory Visit | Attending: Obstetrics and Gynecology

## 2014-09-17 DIAGNOSIS — N393 Stress incontinence (female) (male): Secondary | ICD-10-CM | POA: Diagnosis not present

## 2014-09-17 DIAGNOSIS — D470 Histiocytic and mast cell tumors of uncertain behavior: Secondary | ICD-10-CM | POA: Diagnosis not present

## 2014-09-17 DIAGNOSIS — N816 Rectocele: Secondary | ICD-10-CM

## 2014-09-17 DIAGNOSIS — Z7982 Long term (current) use of aspirin: Secondary | ICD-10-CM | POA: Diagnosis not present

## 2014-09-17 DIAGNOSIS — Z85828 Personal history of other malignant neoplasm of skin: Secondary | ICD-10-CM | POA: Insufficient documentation

## 2014-09-17 DIAGNOSIS — Z87442 Personal history of urinary calculi: Secondary | ICD-10-CM | POA: Insufficient documentation

## 2014-09-17 DIAGNOSIS — E119 Type 2 diabetes mellitus without complications: Secondary | ICD-10-CM | POA: Diagnosis not present

## 2014-09-17 DIAGNOSIS — Z9889 Other specified postprocedural states: Secondary | ICD-10-CM

## 2014-09-17 DIAGNOSIS — Z79899 Other long term (current) drug therapy: Secondary | ICD-10-CM | POA: Insufficient documentation

## 2014-09-17 HISTORY — PX: RECTOCELE REPAIR: SHX761

## 2014-09-17 HISTORY — DX: Myoneural disorder, unspecified: G70.9

## 2014-09-17 LAB — GLUCOSE, CAPILLARY
GLUCOSE-CAPILLARY: 192 mg/dL — AB (ref 70–99)
GLUCOSE-CAPILLARY: 140 mg/dL — AB (ref 70–99)
Glucose-Capillary: 148 mg/dL — ABNORMAL HIGH (ref 70–99)
Glucose-Capillary: 134 mg/dL — ABNORMAL HIGH (ref 70–99)

## 2014-09-17 SURGERY — COLPORRHAPHY, POSTERIOR, FOR RECTOCELE REPAIR
Anesthesia: Spinal | Site: Vagina

## 2014-09-17 MED ORDER — MIDAZOLAM HCL 2 MG/2ML IJ SOLN
INTRAMUSCULAR | Status: AC
  Filled 2014-09-17: qty 2

## 2014-09-17 MED ORDER — LACTATED RINGERS IV SOLN
INTRAVENOUS | Status: DC
  Administered 2014-09-17: 13:00:00 via INTRAVENOUS

## 2014-09-17 MED ORDER — LIDOCAINE HCL (CARDIAC) 20 MG/ML IV SOLN
INTRAVENOUS | Status: DC | PRN
  Administered 2014-09-17: 20 mg via INTRAVENOUS

## 2014-09-17 MED ORDER — IBUPROFEN 600 MG PO TABS
600.0000 mg | ORAL_TABLET | Freq: Four times a day (QID) | ORAL | Status: DC | PRN
  Administered 2014-09-17: 600 mg via ORAL
  Filled 2014-09-17: qty 1

## 2014-09-17 MED ORDER — LIDOCAINE-EPINEPHRINE 1 %-1:100000 IJ SOLN
INTRAMUSCULAR | Status: DC | PRN
  Administered 2014-09-17: 5.5 mL

## 2014-09-17 MED ORDER — LACTATED RINGERS IV SOLN: INTRAVENOUS | Status: DC

## 2014-09-17 MED ORDER — LIDOCAINE-EPINEPHRINE 1 %-1:100000 IJ SOLN
INTRAMUSCULAR | Status: AC
  Filled 2014-09-17: qty 2

## 2014-09-17 MED ORDER — LIDOCAINE HCL (CARDIAC) 20 MG/ML IV SOLN
INTRAVENOUS | Status: AC
  Filled 2014-09-17: qty 5

## 2014-09-17 MED ORDER — BUPIVACAINE IN DEXTROSE 0.75-8.25 % IT SOLN
INTRATHECAL | Status: DC | PRN
  Administered 2014-09-17: 13.5 mg via INTRATHECAL

## 2014-09-17 MED ORDER — BUPIVACAINE HCL (PF) 0.25 % IJ SOLN
INTRAMUSCULAR | Status: AC
  Filled 2014-09-17: qty 30

## 2014-09-17 MED ORDER — HYDROMORPHONE HCL 1 MG/ML IJ SOLN
2.0000 mg | Freq: Once | INTRAMUSCULAR | Status: AC
Start: 2014-09-17 — End: 2014-09-17
  Administered 2014-09-17: 1 mg via INTRAVENOUS
  Filled 2014-09-17: qty 2

## 2014-09-17 MED ORDER — KETOROLAC TROMETHAMINE 30 MG/ML IJ SOLN
INTRAMUSCULAR | Status: AC
  Administered 2014-09-17: 30 mg via INTRAVENOUS
  Filled 2014-09-17: qty 1

## 2014-09-17 MED ORDER — KETOROLAC TROMETHAMINE 30 MG/ML IJ SOLN
15.0000 mg | Freq: Once | INTRAMUSCULAR | Status: AC | PRN
  Administered 2014-09-17: 30 mg via INTRAVENOUS

## 2014-09-17 MED ORDER — FENTANYL CITRATE 0.05 MG/ML IJ SOLN
INTRAMUSCULAR | Status: AC
  Administered 2014-09-17: 25 ug via INTRAVENOUS
  Filled 2014-09-17: qty 2

## 2014-09-17 MED ORDER — ESTRADIOL 0.1 MG/GM VA CREA
TOPICAL_CREAM | VAGINAL | Status: AC
  Filled 2014-09-17: qty 42.5

## 2014-09-17 MED ORDER — STERILE WATER FOR IRRIGATION IR SOLN
Status: DC | PRN
  Administered 2014-09-17: 1000 mL

## 2014-09-17 MED ORDER — FLUMAZENIL 0.5 MG/5ML IV SOLN
INTRAVENOUS | Status: AC
  Filled 2014-09-17: qty 5

## 2014-09-17 MED ORDER — PROPOFOL 10 MG/ML IV EMUL
INTRAVENOUS | Status: AC
  Filled 2014-09-17: qty 50

## 2014-09-17 MED ORDER — INSULIN PUMP
SUBCUTANEOUS | Status: DC
Start: 2014-09-17 — End: 2014-09-17
  Administered 2014-09-17: 2.35 via SUBCUTANEOUS
  Filled 2014-09-17: qty 1

## 2014-09-17 MED ORDER — FLUMAZENIL 0.5 MG/5ML IV SOLN
INTRAVENOUS | Status: DC | PRN
  Administered 2014-09-17: 0.2 mg via INTRAVENOUS

## 2014-09-17 MED ORDER — LACTATED RINGERS IV SOLN
INTRAVENOUS | Status: DC
  Administered 2014-09-17 (×3): via INTRAVENOUS

## 2014-09-17 MED ORDER — ONDANSETRON HCL 4 MG/2ML IJ SOLN
INTRAMUSCULAR | Status: AC
  Filled 2014-09-17: qty 2

## 2014-09-17 MED ORDER — OXYCODONE-ACETAMINOPHEN 5-325 MG PO TABS
1.0000 | ORAL_TABLET | ORAL | Status: DC | PRN
Start: 2014-09-17 — End: 2014-09-17
  Administered 2014-09-17: 1 via ORAL
  Filled 2014-09-17: qty 1

## 2014-09-17 MED ORDER — MIDAZOLAM HCL 5 MG/5ML IJ SOLN
INTRAMUSCULAR | Status: DC | PRN
  Administered 2014-09-17: 2 mg via INTRAVENOUS

## 2014-09-17 MED ORDER — MIDAZOLAM HCL 2 MG/2ML IJ SOLN: 0.5000 mg | Freq: Once | INTRAMUSCULAR | Status: DC | PRN

## 2014-09-17 MED ORDER — ONDANSETRON HCL 4 MG PO TABS: 4.0000 mg | ORAL_TABLET | Freq: Four times a day (QID) | ORAL | Status: DC | PRN

## 2014-09-17 MED ORDER — PROPOFOL 10 MG/ML IV EMUL
INTRAVENOUS | Status: AC
  Filled 2014-09-17: qty 40

## 2014-09-17 MED ORDER — ONDANSETRON HCL 4 MG/2ML IJ SOLN
4.0000 mg | Freq: Four times a day (QID) | INTRAMUSCULAR | Status: DC | PRN
  Administered 2014-09-17: 4 mg via INTRAVENOUS
  Filled 2014-09-17: qty 2

## 2014-09-17 MED ORDER — MENTHOL 3 MG MT LOZG: 1.0000 | LOZENGE | OROMUCOSAL | Status: DC | PRN

## 2014-09-17 MED ORDER — FENTANYL CITRATE 0.05 MG/ML IJ SOLN
25.0000 ug | INTRAMUSCULAR | Status: DC | PRN
Start: 2014-09-17 — End: 2014-09-17
  Administered 2014-09-17 (×2): 25 ug via INTRAVENOUS

## 2014-09-17 MED ORDER — INSULIN ASPART 100 UNIT/ML ~~LOC~~ SOLN
1.1200 [IU] | SUBCUTANEOUS | Status: DC
Start: 2014-09-17 — End: 2014-09-17

## 2014-09-17 MED ORDER — LIDOCAINE IN DEXTROSE 5-7.5 % IV SOLN: INTRAVENOUS | Status: DC | PRN

## 2014-09-17 MED ORDER — KETOROLAC TROMETHAMINE 15 MG/ML IJ SOLN
15.0000 mg | Freq: Four times a day (QID) | INTRAMUSCULAR | Status: DC | PRN
  Filled 2014-09-17: qty 1

## 2014-09-17 MED ORDER — ESTRADIOL 0.1 MG/GM VA CREA
TOPICAL_CREAM | VAGINAL | Status: DC | PRN
  Administered 2014-09-17: 1 via VAGINAL

## 2014-09-17 MED ORDER — 0.9 % SODIUM CHLORIDE (POUR BTL) OPTIME
TOPICAL | Status: DC | PRN
  Administered 2014-09-17: 1000 mL

## 2014-09-17 MED ORDER — ONDANSETRON HCL 4 MG/2ML IJ SOLN
INTRAMUSCULAR | Status: DC | PRN
  Administered 2014-09-17: 4 mg via INTRAVENOUS

## 2014-09-17 MED ORDER — PROPOFOL 10 MG/ML IV BOLUS: INTRAVENOUS | Status: DC | PRN

## 2014-09-17 MED ORDER — SCOPOLAMINE 1 MG/3DAYS TD PT72: 1.0000 | MEDICATED_PATCH | Freq: Once | TRANSDERMAL | Status: DC

## 2014-09-17 MED ORDER — PROPOFOL INFUSION 10 MG/ML OPTIME
INTRAVENOUS | Status: DC | PRN
  Administered 2014-09-17: 75 ug/kg/min via INTRAVENOUS

## 2014-09-17 MED ORDER — DOCUSATE SODIUM 100 MG PO CAPS: 100.0000 mg | ORAL_CAPSULE | Freq: Two times a day (BID) | ORAL | Status: DC

## 2014-09-17 MED ORDER — PROMETHAZINE HCL 25 MG/ML IJ SOLN: 6.2500 mg | INTRAMUSCULAR | Status: DC | PRN

## 2014-09-17 SURGICAL SUPPLY — 32 items
BLADE SURG 11 STRL SS (BLADE) ×4 IMPLANT
CANISTER SUCT 3000ML (MISCELLANEOUS) ×4 IMPLANT
CATH FOLEY 2WAY SLVR  5CC 18FR (CATHETERS) ×2
CATH FOLEY 2WAY SLVR 5CC 18FR (CATHETERS) ×2 IMPLANT
CLOTH BEACON ORANGE TIMEOUT ST (SAFETY) ×4 IMPLANT
DECANTER SPIKE VIAL GLASS SM (MISCELLANEOUS) ×6 IMPLANT
DRAPE HYSTEROSCOPY (DRAPE) ×4 IMPLANT
GAUZE PACKING 2X5 YD STRL (GAUZE/BANDAGES/DRESSINGS) ×4 IMPLANT
GLOVE BIO SURGEON STRL SZ 6.5 (GLOVE) ×7 IMPLANT
GLOVE BIO SURGEONS STRL SZ 6.5 (GLOVE) ×3
GOWN STRL REUS W/TWL LRG LVL3 (GOWN DISPOSABLE) ×16 IMPLANT
LIQUID BAND (GAUZE/BANDAGES/DRESSINGS) ×4 IMPLANT
NEEDLE HYPO 22GX1.5 SAFETY (NEEDLE) ×4 IMPLANT
NS IRRIG 1000ML POUR BTL (IV SOLUTION) ×4 IMPLANT
PACK VAGINAL WOMENS (CUSTOM PROCEDURE TRAY) ×4 IMPLANT
PLUG CATH AND CAP STER (CATHETERS) ×4 IMPLANT
SET CYSTO W/LG BORE CLAMP LF (SET/KITS/TRAYS/PACK) ×4 IMPLANT
SPONGE SURGIFOAM ABS GEL 12-7 (HEMOSTASIS) IMPLANT
SURGIFLO TRUKIT (HEMOSTASIS) IMPLANT
SUT VIC AB 0 CT1 27 (SUTURE) ×16
SUT VIC AB 0 CT1 27XBRD ANBCTR (SUTURE) ×7 IMPLANT
SUT VIC AB 2-0 CT1 27 (SUTURE)
SUT VIC AB 2-0 CT1 TAPERPNT 27 (SUTURE) IMPLANT
SUT VIC AB 2-0 CT2 27 (SUTURE) IMPLANT
SUT VIC AB 2-0 SH 27 (SUTURE) ×20
SUT VIC AB 2-0 SH 27XBRD (SUTURE) ×9 IMPLANT
TOWEL OR 17X24 6PK STRL BLUE (TOWEL DISPOSABLE) ×8 IMPLANT
TRAY FOLEY BAG SILVER LF 14FR (CATHETERS) ×3 IMPLANT
TRAY FOLEY CATH 14FR (SET/KITS/TRAYS/PACK) ×4 IMPLANT
TUBING NON-CON 1/4 X 20 CONN (TUBING) ×3 IMPLANT
TUBING NON-CON 1/4 X 20' CONN (TUBING) ×1
WATER STERILE IRR 1000ML POUR (IV SOLUTION) ×4 IMPLANT

## 2014-09-17 NOTE — Brief Op Note (Signed)
09/17/2014  9:40 AM  PATIENT:  Kelly Glover  63 y.o. female  PRE-OPERATIVE DIAGNOSIS:  Rectocele  POST-OPERATIVE DIAGNOSIS:  Rectocele, enterocele  PROCEDURE:  Procedure(s): POSTERIOR REPAIR , enterocele repair (N/A)  SURGEON:  Surgeon(s) and Role:    * Lyman Speller, MD - Assisting    * Arnold Kester E Amundson de Berton Lan, MD - Primary  PHYSICIAN ASSISTANT: NA  ASSISTANTS: Lyman Speller, MD   ANESTHESIA:   local and spinal  EBL:  Total I/O In: 1000 [I.V.:1000] Out: 175 [Urine:150; Blood:25]  BLOOD ADMINISTERED:none  DRAINS: Urinary Catheter (Foley)   LOCAL MEDICATIONS USED:  LIDOCAINE   SPECIMEN:  No Specimen  DISPOSITION OF SPECIMEN:  N/A  COUNTS:  YES  TOURNIQUET:  * No tourniquets in log *  DICTATION: .Other Dictation: Dictation Number    PLAN OF CARE:  Admit for extended recovery  PATIENT DISPOSITION:  PACU - hemodynamically stable.   Delay start of Pharmacological VTE agent (>24hrs) due to surgical blood loss or risk of bleeding: not applicable

## 2014-09-17 NOTE — Progress Notes (Signed)
Update to History and Physical  Had tear in left biceps tendon.   Patient examined.   OK to proceed with rectocele repair.

## 2014-09-17 NOTE — Transfer of Care (Signed)
Immediate Anesthesia Transfer of Care Note  Patient: Kelly Glover  Procedure(s) Performed: Procedure(s): POSTERIOR REPAIR , enterocele repair (N/A)  Patient Location: PACU  Anesthesia Type:Spinal  Level of Consciousness: awake, alert , oriented and patient cooperative  Airway & Oxygen Therapy: Patient Spontanous Breathing  Post-op Assessment: Report given to PACU RN and Post -op Vital signs reviewed and stable  Post vital signs: Reviewed and stable  Complications: No apparent anesthesia complications

## 2014-09-17 NOTE — Discharge Instructions (Signed)
Please continue your insulin pump per your home regimen!!!!!  I am pleased that you did so well with your surgery today to repair the rectocele and enterocele.  Josefa Half, MD  Anterior and Posterior Colporrhaphy Anterior or posterior colporrhaphy is surgery to fix a prolapse of organs in the genital tract. Prolapse means the falling down, bulging, dropping, or drooping of an organ. Organs that commonly prolapse include the rectum, bladder, vagina, and uterus. Prolapse can affect a single organ or several organs at the same time. This often worsens when women stop having their monthly periods (menopause) because estrogen loss weakens the muscles and tissues in the genital tract. In addition, prolapse happens when the organs are damaged or weakened. This commonly happens after childbirth and as a result of aging. Surgery is often done for severe prolapses.  The type of colporrhaphy done depends on the type of genital prolapse. Types of genital prolapse include the following:   Cystocele. This is a prolapse of the upper (anterior) wall of the vagina. The anterior wall bulges into the vagina and brings the bladder with it.   Rectocele. This is a prolapse of the lower (posterior) wall of the vagina. The posterior vaginal wall bulges into the vagina and brings the rectum with it.   Enterocele. This is a prolapse of part of the pelvic organs called the pouch of Douglas. It also involves a portion of the small bowel. It appears as a bulge under the neck of the uterus at the top of the back wall of the vagina.   Procidentia. This is a complete prolapse of the uterus and the cervix. The prolapse can be seen and felt coming out of the vagina. LET [facility] CARE PROVIDER KNOW ABOUT:   Any allergies you have.   All medicines you are taking, including vitamins, herbs, eye drops, creams, and over-the-counter medicines.   Previous problems you or members of your family have had with the use of  anesthetics.   Any blood disorders you have.   Previous surgeries you have had.   Medical conditions you have.   Smoking history or history of alcohol use.   Possibility of pregnancy, if this applies.  RISKS AND COMPLICATIONS Generally, anterior or posterior colporrhaphy is a safe procedure. However, as with any procedure, complications can occur. Possible complications include:   Infection.   Damage to other organs during surgery.   Bleeding after surgery.   Problems urinating.   Problems from the anesthetic.  BEFORE THE PROCEDURE  Ask your health care provider about changing or stopping your regular medicines.   Do not eat or drink anything for at least 8 hours before the surgery.   If you smoke, do not smoke for at least 2 weeks before the surgery.   Make plans to have someone drive you home after your hospital stay. Also, arrange for someone to help you with activities during recovery. PROCEDURE  You may be given medicine to help you relax before the surgery (sedative). During the surgery you will be given medicine to make you sleep through the procedure (general anesthetic) or medicine to numb you from the waist down (spinal anesthetic). This medicine will be given through an intravenous (IV) access tube that is put into one of your veins.  The procedure will vary depending on the type of repair:   Anterior repair. A cut (incision) is made in the midline section of the front part of the vaginal wall. A triangular-shaped piece of vaginal tissue  is removed, and the stronger, healthier tissue is sewn together in order to support and suspend the bladder.   Posterior repair. An incision is made midline on the back wall of the vagina. A triangular portion of vaginal skin is removed to expose the muscle. Excess tissue is removed, and stronger, healthier muscle and ligament tissue is sewn together to support the rectum.   Anterior and posterior repair. Both  procedures are done during the same surgery. AFTER THE PROCEDURE You will be taken to a recovery area. Your blood pressure, pulse, breathing, and temperature (vital signs) will be monitored. This is done until you are stable. Then you will be transferred to a hospital room.  After surgery, you will have a small rubber tube in place to drain your bladder (urinary catheter). This will be in place for 2 to 7 days or until your bladder is working properly on its own. The IV access tube will be removed in 1 to 3 days. You may have a gauze packing in your vagina to prevent bleeding. This will be removed 2 or 3 days after the surgery. You will likely need to stay in the hospital for 3 to 5 days.  Document Released: 12/12/2003 Document Revised: 05/24/2013 Document Reviewed: 02/10/2013 [facility] Patient Information 2015 [place], [place]. This information is not intended to replace advice given to you by your health care provider. Make sure you discuss any questions you have with your health care provider.  Anterior and Posterior Colporrhaphy, Care After Refer to this sheet in the next few weeks. These instructions provide you with information on caring for yourself after your procedure. Your health care provider may also give you more specific instructions. Your treatment has been planned according to current medical practices, but problems sometimes occur. Call your health care provider if you have any problems or questions after your procedure. HOME CARE INSTRUCTIONS   Take frequent rest periods throughout the day.   Only take over-the-counter or prescription medicines as directed by your health care provider.   Avoid strenuous activity such as heavy lifting (more than 10 pounds [4.5 kg]), pushing, and pulling until your health care provider says it is okay.   Take showers if your health care provider approves. Pat incisions dry. Do not rub incisions with a washcloth or towel. Do not take tub baths until  your health care provider approves.   Wear compression stockings as directed by your health care provider. These stockings help prevent blood clots from forming in your legs.   Talk with your health care provider about when you may return to work and your exercise routine.   Do not drive until your health care provider approves.   You may resume your normal diet. Eat a well-balanced diet.   Drink enough fluids to keep your urine clear or pale yellow.   Your normal bowel function should return. If you become constipated, you may:   Take a mild laxative.   Add fruit and bran to your diet.   Drink more fluids.  Do not have sexual intercourse until permitted by your health care provider.  Follow up with your health care provider as directed. SEEK MEDICAL CARE IF: You have persistent nausea or vomiting.  SEEK IMMEDIATE MEDICAL CARE IF:   You have increased bleeding (more than a small spot) from the vaginal area.   Your pain is not relieved with medicine or becomes worse.   You have redness, swelling, or increasing pain in the vaginal area.  You have abdominal pain.   You see pus coming from the wounds.   You develop a fever.   You have a foul smell coming from your vaginal area.   You develop light-headedness or you feel faint.   You have difficulty breathing.  MAKE SURE YOU:  Understand these instructions.  Will watch your condition.  Will get help right away if you are not doing well or get worse. Document Released: 04/09/2005 Document Revised: 05/24/2013 Document Reviewed: 02/10/2013 [facility] Patient Information 2015 [place], [place]. This information is not intended to replace advice given to you by your health care provider. Make sure you discuss any questions you have with your health care provider.

## 2014-09-17 NOTE — Progress Notes (Signed)
RN witnessed mast cell reaction that MD noted.  Husband at bedside entire time.  Comfort measures were taken to help pt cool off by removing SCDs and TED hose, and applying cool cloth to neck.  Husband had pt drink a 15gram instant glucose drink.  He stated that her symptoms were typical and that there was nothing more for the RN to do.  When RN rechecked pt 30 - 40 minutes later and she was fine.  Pt and husband both said that this was a typical experience for her.  Pt discharged to home with husband.  Condition stable.  Pt ambulated to car with Santiago Bur, NT.  No equipment for home ordered at discharge.

## 2014-09-17 NOTE — Anesthesia Postprocedure Evaluation (Signed)
Anesthesia Post Note  Patient: Kelly Glover  Procedure(s) Performed: Procedure(s) (LRB): POSTERIOR REPAIR , enterocele repair (N/A)  Anesthesia type: Spinal  Patient location: PACU  Post pain: Pain level controlled  Post assessment: Post-op Vital signs reviewed  Last Vitals:  Filed Vitals:   09/17/14 1015  BP: 125/66  Pulse: 63  Temp:   Resp: 18    Post vital signs: Reviewed  Level of consciousness: awake  Complications: No apparent anesthesia complications

## 2014-09-17 NOTE — Anesthesia Procedure Notes (Signed)
Spinal Patient location during procedure: OR Start time: 09/17/2014 7:41 AM Staffing Anesthesiologist: Rudean Curt Performed by: anesthesiologist  Preanesthetic Checklist Completed: patient identified, site marked, surgical consent, pre-op evaluation, timeout performed, IV checked, risks and benefits discussed and monitors and equipment checked Spinal Block Patient position: sitting Prep: DuraPrep Patient monitoring: heart rate, cardiac monitor, continuous pulse ox and blood pressure Approach: midline Location: L3-4 Injection technique: single-shot Needle Needle type: Sprotte  Needle gauge: 24 G Needle length: 9 cm Assessment Sensory level: T4 Additional Notes Patient identified.  Risk benefits discussed including failed block, incomplete pain control, headache, nerve damage, paralysis, blood pressure changes, nausea, vomiting, reactions to medication both toxic or allergic, and postpartum back pain.  Patient expressed understanding and wished to proceed.  All questions were answered.  Sterile technique used throughout procedure.  CSF was clear.  No parasthesia or other complications.  Please see nursing notes for vital signs.

## 2014-09-17 NOTE — Progress Notes (Signed)
Inpatient Diabetes Program Recommendations  AACE/ADA: New Consensus Statement on Inpatient Glycemic Control (2013)  Target Ranges:  Prepandial:   less than 140 mg/dL      Peak postprandial:   less than 180 mg/dL (1-2 hours)      Critically ill patients:  140 - 180 mg/dL   Pt post-op with Animas insulin pump running at normal/usual basal rates as instructed per Dr Forde Dandy prior to surgery.Pt may possibly be d/c'd today. Spoke with pt on phone to inquire as to her basal rates and bolus setings Basal rates: 2400 -- 0400 @ 1.15 units/hr 0400 - 0600 @ 1.5 units/hr 0600 - 1100 @ 1.25 units/hr 1100 - 1600 @ 1.275 units/hr 1600 - 2200 @ 1.75 units/hr 2200 - 2400 @ 2.2 units/hr Total basal insulin per 24 hrs is 45.95 units/day  Bolus setup: Insulin to CHO ratio: 1 unit/9 grams cho Sensitivity Factor 1 unit/ 26 mg/dL (greater than 100 mg/dL) Goal target cbg: 100-100   Order set entered and flowsheet in process per RN. Contract being signed per RN  Pt able to tell me the basal rates and bolus settings. Please feel free to call with any questins. Thank you, Rosita Kea, RN, CNS, Diabetes Coordinator [phone])

## 2014-09-17 NOTE — Progress Notes (Signed)
Day of Surgery Procedure(s) (LRB): POSTERIOR REPAIR , enterocele repair (N/A)  Subjective: Patient reports nausea, tolerating PO and + flatus.   Patient had a reaction after Percocet of mast cell response.  They forgot that Percocet causes this for her.  Had diaphoresis, nausea, and feeling "out of it." This is a common event for the patient and she and her husband, Dr. Gus Height, are accustomed to treating it. She uses an EpiPen if needed.  Foley and vaginal packing out.  No void yet.  Minimal bleeding.  Tolerating regular diet.   Desires discharge tonight if able to void.  Has Vicodin at home and has taken in the past without problem.   Objective: I have reviewed patient's vital signs and intake and output. T 98.2, BP 138/69, P 59, RR 18. I/O 1780/315 cc.  FSBG - 140.   General: alert, cooperative and no distress Resp: clear to auscultation bilaterally Cardio: regular rate and rhythm, S1, S2 normal, no murmur, click, rub or gallop GI: soft, non-tender; bowel sounds normal; no masses,  no organomegaly Vaginal Bleeding: minimal  Assessment: s/p Procedure(s): POSTERIOR REPAIR , enterocele repair (N/A): stable  Plan: Encourage ambulation Discharge home if able to void  Instructions and precautions given.  Follow up in one week, sooner as needed.    LOS: 0 days    Arloa Koh 09/17/2014, 5:58 PM

## 2014-09-18 ENCOUNTER — Encounter (HOSPITAL_COMMUNITY): Payer: Self-pay | Admitting: Obstetrics and Gynecology

## 2014-09-18 NOTE — Op Note (Signed)
Kelly Glover, Kelly Glover NO.:  [id number]  MEDICAL RECORD NO.:  [id number]  LOCATION:  9305                          FACILITY:  [facility]  PHYSICIAN:  Lenard Galloway, M.D.   DATE OF BIRTH:  06-Nov-1950  DATE OF PROCEDURE:  09/17/2014 DATE OF DISCHARGE:  09/17/2014                              OPERATIVE REPORT   PREOPERATIVE DIAGNOSIS:  Second-degree rectocele.  POSTOPERATIVE DIAGNOSIS:  Second-degree enterocele with rectocele.  PROCEDURE:  Posterior colporrhaphy, enterocele repair.  SURGEON:  Lenard Galloway, M.D.  ASSISTANT:  Lyman Speller, MD  ANESTHESIA:  Spinal, local with 1% lidocaine with epinephrine 1:100,000.  IV FLUIDS:  1000 mL Ringer's lactate.  EBL:  25 mL.  URINE OUTPUT:  150 mL.  COMPLICATIONS:  None.  INDICATIONS FOR THE PROCEDURE:  The patient is a 63 year old, para 2 Caucasian female, status post laparoscopically assisted vaginal hysterectomy with anterior colporrhaphy, who presents for evaluation and treatment of a known rectocele.  The patient has been experiencing minimal stress incontinence and underwent multichannel urodynamic testing which did not confirm stress incontinence.  The patient is wishing to avoid a mid urethral sling.  She is experiencing incontinence of gas and she wishes to have her rectocele repair.  Risks, benefits, and alternatives have been reviewed with the patient who wishes to proceed.  FINDINGS:  Examination under anesthesia revealed excellent anterior and apical support.  The defect was along the posterior vaginal wall and represented really a second-degree enterocele with a much smaller rectocele.  The cervix was absent.  Rectal examination at the termination of the posterior colporrhaphy demonstrated the absence of sutures in the rectum.  SPECIMENS:  None.  PROCEDURE:  The patient was reidentified in the preoperative hold area. The patient did receive cefotetan 2 g IV for antibiotic prophylaxis  and she received both TED hose and PAS stockings for DVT prophylaxis.  In the operating room, the patient received a spinal anesthetic and was then placed in the dorsal lithotomy position with the Allen stirrups. The lower abdomen, vagina, and perineum were then sterilely prepped and she was sterilely draped.  A Foley catheter was placed inside the bladder and left to gravity drainage throughout the procedure.  An examination under anesthesia was performed.  The patient was noted to have a long perineal body.  Allis clamps were used to mark the perineal body and then the posterior vaginal mucosa all the way to the top of the vaginal apex.  The mucosa of the perineal body and the posterior vaginal mucosa were injected with 1% lidocaine with epinephrine 1:100,000.  A triangular wedge of epithelium was excised from the perineal body and the posterior vaginal mucosa was opened vertically in the midline with the Metzenbaum scissors.  Sharp dissection was used to dissect the rectovaginal fascia off of the overlying vaginal mucosa.  The dissection continued to the level of the vaginal apex and the enterocele was dissected away from the subvaginal tissues.  Hemostasis was excellent during the procedure.  The enterocele sac was elevated with 2 hemostat clamps and an attempt was made to enter sharply.  There was bowel that was noted at the base of the  enterocele sac and a decision was made to not proceed with the extensive dissection which would have been required in this area.  Superficial pursestring sutures of 2-0 Vicryl were placed to reduce the enterocele.  These began in the middle of the enterocele and extended out toward the larger perimeter of the enterocele.  These were closed in a pursestring fashion, which sequentially reduced the enterocele.  The rectocele was repaired at this time.  Vertical mattress sutures of 2- 0 Vicryl were first placed more medially.  The 0 Vicryl  vertical mattress sutures were then placed more laterally which gave 2 layers of support and reduced the rectocele well.  Dissection along the perineal body was performed to get down towards the area of the rectal sphincter.  There was poor support across the perineal body.  The rectal sphincter muscle was grasped with 2 Allis clamps, 1 on each side.  The perineal body was supported at this time with a series of crown sutures of 0 Vicryl.  A vertical mattress suture of 0 Vicryl was placed in the rectal sphincter to provide additional closure power.  Excess vaginal mucosa was trimmed at this time and the posterior vaginal wall was closed with a running lock suture of 2-0 Vicryl down to the hymen.  A running suture of 2-0 Vicryl was then used to close the superficial perineal muscles.  This suture was continued up the perineal body along the mucosa with a subcuticular suture.  The knot was tied. This suture was then attached to the suture that closed the posterior vaginal mucosa down to the hymen.  There was excellent support of the vagina in good depth and caliber at the termination of the procedure.  Final rectal exam confirmed the absence of sutures in the rectum.  Hemostasis was excellent.  A gauze packing with Estrace cream was placed inside the vagina and a Foley catheter was left to gravity drainage.  The patient was escorted to the recovery room in stable condition. There were no complications.  All needle, instrument, and sponge counts were correct.     Lenard Galloway, M.D.     BES/MEDQ  D:  09/17/2014  T:  09/18/2014  Job:  [id number]

## 2014-09-19 ENCOUNTER — Other Ambulatory Visit: Payer: Self-pay | Admitting: Obstetrics and Gynecology

## 2014-09-19 ENCOUNTER — Encounter: Payer: Self-pay | Admitting: Obstetrics and Gynecology

## 2014-09-19 MED ORDER — TRAMADOL HCL 50 MG PO TABS: 50.0000 mg | ORAL_TABLET | Freq: Four times a day (QID) | ORAL | Status: DC | PRN

## 2014-09-19 NOTE — Progress Notes (Signed)
Patient notified rx sent to [facility].  Spoke with Mortimer Fries at [facility] to confirm.  Routing to provider for final review. Patient agreeable to disposition. Will close encounter

## 2014-09-24 ENCOUNTER — Ambulatory Visit (INDEPENDENT_AMBULATORY_CARE_PROVIDER_SITE_OTHER): Payer: BC Managed Care – PPO | Admitting: Obstetrics and Gynecology

## 2014-09-24 ENCOUNTER — Encounter: Payer: Self-pay | Admitting: Obstetrics and Gynecology

## 2014-09-24 VITALS — BP 124/70 | HR 60 | Ht 65.0 in

## 2014-09-24 DIAGNOSIS — Z9889 Other specified postprocedural states: Secondary | ICD-10-CM

## 2014-09-24 NOTE — Progress Notes (Signed)
Patient ID: Kelly Glover, female   DOB: 03/29/1951, 63 y.o.   MRN: [id number] GYNECOLOGY  VISIT   HPI: 63 y.o.   Married  Caucasian  female   [phone] with No LMP recorded. Patient has had a hysterectomy.   here for 1 week follow up.   Patient had rectocele and enterocele repair one week ago.  Taking Tramadol for pain.   Some abdominal discomfort.  Took Augmentin and Levsin used to treat diverticulitis.   From Dr. Earlean Shawl.  Having period like cramps on the left side.  Not taking pain medication.   No fevers.   Little nausea.  No vomiting.  Having BMs.  No spotting.  No drainage.   GYNECOLOGIC HISTORY: No LMP recorded. Patient has had a hysterectomy. Contraception:  hysterectomy  Menopausal hormone therapy: Estrace 46m        OB History    Gravida Para Term Preterm AB TAB SAB Ectopic Multiple Living   4 2 2  2  1 1  2          Patient Active Problem List   Diagnosis Date Noted  . Status post surgery 09/17/2014  . Rectocele 07/19/2014  . Genuine stress incontinence, female 07/19/2014  . Mast cell disorder     Past Medical History  Diagnosis Date  . Diabetes   . History of kidney stones   . History of diverticulitis   . 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 PONV (postoperative nausea and vomiting)     Past Surgical History  Procedure Laterality Date  . Total abdominal hysterectomy w/ bilateral salpingoophorectomy  05/20/98  . Breast biopsy Left ?    benign  . Tonsillectomy  childhood  . Diagnostic laparoscopy    . Rectocele repair N/A 09/17/2014    Procedure: POSTERIOR REPAIR , enterocele repair;  Surgeon: BJamey Reasde CBerton Lan MD;  [facility];  Service: Gynecology;  Laterality: N/A;    Current Outpatient Prescriptions  Medication Sig Dispense Refill  . aspirin EC 81 MG tablet Take 81 mg by  mouth daily.    . cetirizine (ZYRTEC) 10 MG tablet Take 10 mg by mouth daily.    . Cholecalciferol (VITAMIN D) 2000 UNITS CAPS Take 1 capsule by mouth daily.    . CRESTOR 20 MG tablet Take 1 tablet by mouth daily.    .Marland Kitchenestradiol (ESTRACE) 2 MG tablet   0  . famotidine (PEPCID AC) 10 MG chewable tablet Chew 10 mg by mouth daily.     .Marland KitchenFOLIC ACID PO Take 4034mcg by mouth daily.     .Marland Kitchenibuprofen (ADVIL,MOTRIN) 800 MG tablet Take 1 tablet (800 mg total) by mouth every 8 (eight) hours as needed. 30 tablet 0  . JARDIANCE 10 MG TABS Take 1 tablet by mouth daily.    .Marland KitchenNOVOLOG 100 UNIT/ML injection Inject into the skin as directed. Insulin pump    . omeprazole-sodium bicarbonate (ZEGERID) 40-1100 MG per capsule Take 1 capsule by mouth daily before breakfast.    . ondansetron (ZOFRAN) 4 MG tablet Take 1 tablet (4 mg total) by mouth every 8 (eight) hours as needed for nausea or vomiting. 20 tablet 0  . ONE TOUCH ULTRA TEST test strip     . ramipril (ALTACE) 2.5 MG capsule Take 1 capsule by mouth  daily.    . sertraline (ZOLOFT) 100 MG tablet Take 1 tablet by mouth daily.    Marland Kitchen ZETIA 10 MG tablet Take 1 tablet by mouth daily.     No current facility-administered medications for this visit.     ALLERGIES: Diflucan; Scallops; Griseofulvin; Percocet; and Doxycycline  Family History  Problem Relation Age of Onset  . Diabetes Mother   . Heart disease Mother   . Heart disease Father   . Thyroid disease Sister   . Asthma Maternal Grandmother 13  . Diabetes Maternal Grandmother   . Diabetes Maternal Grandfather   . Graves' disease Son   . Breast cancer Maternal Grandmother   . Thyroid disease Son     History   Social History  . Marital Status: Married    Spouse Name: N/A    Number of Children: N/A  . Years of Education: N/A   Occupational History  . Not on file.   Social History Main Topics  . Smoking status: Never Smoker   . Smokeless tobacco: Never Used  . Alcohol Use: No  . Drug Use:  No  . Sexual Activity:    Partners: Male    Birth Control/ Protection: Surgical     Comment: hysterectomy   Other Topics Concern  . Not on file   Social History Narrative    ROS:  Pertinent items are noted in HPI.  PHYSICAL EXAMINATION:    BP 124/70 mmHg  Pulse 60  Ht 5' 5"  (1.651 m)  Wt      General appearance: alert, cooperative and looks well.    Pelvic: External genitalia: sutures intact.               Urethra:  normal appearing urethra with no masses, tenderness or lesions              Bartholins and Skenes: normal                 Vagina:  Sutures intact.  Agglutination at the vaginal cuff - broken up with digital examination.  Small amount of bleeding and spotting noted.                                                  ASSESSMENT  Status post posterior colporrhaphy and enterocele repair.  Vaginal cuff agglutination resolved with bimanual examination.   PLAN  Surgical procedure explained.  Return in one week for re-examination.  May need some vaginal estrogen cream to keep this from reforming. Will reassess at next visit.  Patient told to expect some vaginal spotting.  Encouraged ambulation but not to the point of doing exercise.    An After Visit Summary was printed and given to the patient.  __15____ minutes face to face time of which over 50% was spent in counseling.

## 2014-10-01 ENCOUNTER — Ambulatory Visit (INDEPENDENT_AMBULATORY_CARE_PROVIDER_SITE_OTHER): Payer: BC Managed Care – PPO | Admitting: Obstetrics and Gynecology

## 2014-10-01 ENCOUNTER — Encounter: Payer: Self-pay | Admitting: Obstetrics and Gynecology

## 2014-10-01 VITALS — BP 110/60 | HR 66 | Temp 98.9°F | Ht 65.0 in

## 2014-10-01 DIAGNOSIS — Z9889 Other specified postprocedural states: Secondary | ICD-10-CM

## 2014-10-01 NOTE — Patient Instructions (Signed)
Call if you need anything!

## 2014-10-01 NOTE — Progress Notes (Signed)
Patient ID: Kelly Glover, female   DOB: 12-08-1950, 63 y.o.   MRN: [id number] GYNECOLOGY  VISIT   HPI: 63 y.o.   Married  Caucasian  female   [phone] with No LMP recorded. Patient has had a hysterectomy.   here for 2 week follow up.  Status post rectocele and enterocele repair.  Had vaginal agglutination with post op week number one vaginal exam.   More tired.  Having the chills.  No known fever.  Stopped ERT for a couple of weeks.  Pelvic cramping is better.  Still taking stool softeners.  Feels good overall.   Blood sugar is in good control.   Sees her PCP, Dr. Forde Dandy, tomorrow and wants to give him a report about what surgery was done.  Has asked for a copy of this today from our office.   GYNECOLOGIC HISTORY: No LMP recorded. Patient has had a hysterectomy. Contraception: hysterectomy   Menopausal hormone therapy: Estrace 9m         OB History    Gravida Para Term Preterm AB TAB SAB Ectopic Multiple Living   4 2 2  2  1 1  2          Patient Active Problem List   Diagnosis Date Noted  . Status post surgery 09/17/2014  . Rectocele 07/19/2014  . Genuine stress incontinence, female 07/19/2014  . Mast cell disorder     Past Medical History  Diagnosis Date  . Diabetes   . History of kidney stones   . History of diverticulitis   . 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 PONV (postoperative nausea and vomiting)     Past Surgical History  Procedure Laterality Date  . Total abdominal hysterectomy w/ bilateral salpingoophorectomy  05/20/98  . Breast biopsy Left ?    benign  . Tonsillectomy  childhood  . Diagnostic laparoscopy    . Rectocele repair N/A 09/17/2014    Procedure: POSTERIOR REPAIR , enterocele repair;  Surgeon: BJamey Reasde CBerton Lan MD;  Location: [facility];  Service: Gynecology;  Laterality: N/A;     Current Outpatient Prescriptions  Medication Sig Dispense Refill  . aspirin EC 81 MG tablet Take 81 mg by mouth daily.    . cetirizine (ZYRTEC) 10 MG tablet Take 10 mg by mouth daily.    . Cholecalciferol (VITAMIN D) 2000 UNITS CAPS Take 1 capsule by mouth daily.    . CRESTOR 20 MG tablet Take 1 tablet by mouth daily.    .Marland Kitchenestradiol (ESTRACE) 2 MG tablet   0  . famotidine (PEPCID AC) 10 MG chewable tablet Chew 10 mg by mouth daily.     .Marland KitchenFOLIC ACID PO Take 4676mcg by mouth daily.     .Marland Kitchenibuprofen (ADVIL,MOTRIN) 800 MG tablet Take 1 tablet (800 mg total) by mouth every 8 (eight) hours as needed. 30 tablet 0  . JARDIANCE 10 MG TABS Take 1 tablet by mouth daily.    .Marland KitchenNOVOLOG 100 UNIT/ML injection Inject into the skin as directed. Insulin pump    . omeprazole-sodium bicarbonate (ZEGERID) 40-1100 MG per capsule Take 1 capsule by mouth daily before breakfast.    . ondansetron (ZOFRAN) 4 MG tablet Take 1 tablet (4 mg total) by mouth every 8 (eight) hours as needed for nausea or vomiting. 2Chambers  tablet 0  . ONE TOUCH ULTRA TEST test strip     . ramipril (ALTACE) 2.5 MG capsule Take 1 capsule by mouth daily.    . sertraline (ZOLOFT) 100 MG tablet Take 1 tablet by mouth daily.    Marland Kitchen ZETIA 10 MG tablet Take 1 tablet by mouth daily.     No current facility-administered medications for this visit.     ALLERGIES: Diflucan; Scallops; Griseofulvin; Percocet; and Doxycycline  Family History  Problem Relation Age of Onset  . Diabetes Mother   . Heart disease Mother   . Heart disease Father   . Thyroid disease Sister   . Asthma Maternal Grandmother 14  . Diabetes Maternal Grandmother   . Diabetes Maternal Grandfather   . Graves' disease Son   . Breast cancer Maternal Grandmother   . Thyroid disease Son     History   Social History  . Marital Status: Married    Spouse Name: N/A    Number of Children: N/A  . Years of Education: N/A   Occupational History  . Not on file.   Social History  Main Topics  . Smoking status: Never Smoker   . Smokeless tobacco: Never Used  . Alcohol Use: No  . Drug Use: No  . Sexual Activity:    Partners: Male    Birth Control/ Protection: Surgical     Comment: hysterectomy   Other Topics Concern  . Not on file   Social History Narrative    ROS:  Pertinent items are noted in HPI.  PHYSICAL EXAMINATION:    BP 110/60 mmHg  Pulse 66  Ht 5' 5"  (1.651 m)  Wt     Temp 98.9  General appearance: alert, cooperative and appears stated age   Pelvic:  Bimanual exam - no vaginal agglutination.    Sutures intact.  Good depth and caliber to vagina.                    ASSESSMENT  Doing well post op.  No vaginal agglutination.   PLAN  Continue decreased activity.  Follow up in 2 weeks.  Anticipate starting vaginal estrogen cream in 2 weeks.    An After Visit Summary was printed and given to the patient.  __15____ minutes face to face time of which over 50% was spent in counseling.

## 2014-10-17 ENCOUNTER — Encounter: Payer: Self-pay | Admitting: Obstetrics and Gynecology

## 2014-10-17 ENCOUNTER — Ambulatory Visit (INDEPENDENT_AMBULATORY_CARE_PROVIDER_SITE_OTHER): Payer: Managed Care, Other (non HMO) | Admitting: Obstetrics and Gynecology

## 2014-10-17 VITALS — BP 130/72 | HR 70 | Ht 65.0 in

## 2014-10-17 DIAGNOSIS — Z9889 Other specified postprocedural states: Secondary | ICD-10-CM

## 2014-10-17 MED ORDER — ESTRADIOL 0.1 MG/GM VA CREA: TOPICAL_CREAM | VAGINAL | Status: DC

## 2014-10-17 NOTE — Patient Instructions (Signed)
Please use the estrogen cream 1/2 gm per vagina at bedtime for two weeks and then reduce to 1/2 gm per vagina at bedtime twice a week.

## 2014-10-17 NOTE — Progress Notes (Signed)
Patient ID: Kelly Glover, female   DOB: 1951/05/24, 64 y.o.   MRN: [id number] GYNECOLOGY  VISIT   HPI: 64 y.o.   Married  Caucasian  female   [phone] with No LMP recorded. Patient has had a hysterectomy.   here for 4 week post op.   No vaginal bleeding.  ? Drainage.  Yeast infections are under control.   Just restarted Januvia 10 days ago.  Still a little careful with laughing for gas control.   BMs are more normal.  Using pericolace every other day.   GYNECOLOGIC HISTORY: No LMP recorded. Patient has had a hysterectomy. Contraception: Hysterectomy  Menopausal hormone therapy: Estrace 15m        OB History    Gravida Para Term Preterm AB TAB SAB Ectopic Multiple Living   4 2 2  2  1 1  2          Patient Active Problem List   Diagnosis Date Noted  . Status post surgery 09/17/2014  . Rectocele 07/19/2014  . Genuine stress incontinence, female 07/19/2014  . Mast cell disorder     Past Medical History  Diagnosis Date  . Diabetes   . History of kidney stones   . History of diverticulitis   . 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 PONV (postoperative nausea and vomiting)     Past Surgical History  Procedure Laterality Date  . Total abdominal hysterectomy w/ bilateral salpingoophorectomy  05/20/98  . Breast biopsy Left ?    benign  . Tonsillectomy  childhood  . Diagnostic laparoscopy    . Rectocele repair N/A 09/17/2014    Procedure: POSTERIOR REPAIR , enterocele repair;  Surgeon: BJamey Reasde CBerton Lan MD;  [facility];  Service: Gynecology;  Laterality: N/A;    Current Outpatient Prescriptions  Medication Sig Dispense Refill  . aspirin EC 81 MG tablet Take 81 mg by mouth daily.    . cetirizine (ZYRTEC) 10 MG tablet Take 10 mg by mouth daily.    . Cholecalciferol (VITAMIN D) 2000 UNITS CAPS Take 1  capsule by mouth daily.    . CRESTOR 20 MG tablet Take 1 tablet by mouth daily.    .Marland Kitchenestradiol (ESTRACE) 2 MG tablet   0  . famotidine (PEPCID AC) 10 MG chewable tablet Chew 10 mg by mouth daily.     .Marland KitchenFOLIC ACID PO Take 4149mcg by mouth daily.     .Marland Kitchenibuprofen (ADVIL,MOTRIN) 800 MG tablet Take 1 tablet (800 mg total) by mouth every 8 (eight) hours as needed. 30 tablet 0  . JARDIANCE 10 MG TABS Take 1 tablet by mouth daily.    .Marland KitchenNOVOLOG 100 UNIT/ML injection Inject into the skin as directed. Insulin pump    . omeprazole-sodium bicarbonate (ZEGERID) 40-1100 MG per capsule Take 1 capsule by mouth daily before breakfast.    . ondansetron (ZOFRAN) 4 MG tablet Take 1 tablet (4 mg total) by mouth every 8 (eight) hours as needed for nausea or vomiting. 20 tablet 0  . ONE TOUCH ULTRA TEST test strip     . ramipril (ALTACE) 2.5 MG capsule Take 1 capsule by mouth daily.    . sertraline (ZOLOFT) 100 MG tablet Take 1 tablet by mouth daily.    .Marland KitchenZETIA 10 MG tablet Take 1  tablet by mouth daily.     No current facility-administered medications for this visit.     ALLERGIES: Diflucan; Scallops; Griseofulvin; Percocet; and Doxycycline  Family History  Problem Relation Age of Onset  . Diabetes Mother   . Heart disease Mother   . Heart disease Father   . Thyroid disease Sister   . Asthma Maternal Grandmother 72  . Diabetes Maternal Grandmother   . Diabetes Maternal Grandfather   . Graves' disease Son   . Breast cancer Maternal Grandmother   . Thyroid disease Son     History   Social History  . Marital Status: Married    Spouse Name: N/A    Number of Children: N/A  . Years of Education: N/A   Occupational History  . Not on file.   Social History Main Topics  . Smoking status: Never Smoker   . Smokeless tobacco: Never Used  . Alcohol Use: No  . Drug Use: No  . Sexual Activity:    Partners: Male    Birth Control/ Protection: Surgical     Comment: hysterectomy   Other Topics Concern   . Not on file   Social History Narrative    ROS:  Pertinent items are noted in HPI.  PHYSICAL EXAMINATION:    BP 130/72 mmHg  Pulse 70  Ht 5' 5"  (1.651 m)  Wt      General appearance: alert, cooperative and appears stated age   Pelvic: External genitalia:  no lesions              Urethra:  normal appearing urethra with no masses, tenderness or lesions              Bartholins and Skenes: normal                 Vagina: normal appearing vagina with normal color and discharge, no lesions.  Small amount of suture material present.  Good length and caliber.               Cervix: Absent                   Bimanual Exam:  Uterus:   Absent.                                      Adnexa:  No masses                                    ASSESSMENT   Healing well status post rectocele and enterocele repair.   PLAN  Start Estrace vaginal cream 1/2 gm pv at hs for 2 weeks and then twice weekly. Pelvic rest for 4 more weeks. Recheck in 4 weeks.   An After Visit Summary was printed and given to the patient.  ___15___ minutes face to face time of which over 50% was spent in counseling.

## 2014-11-28 ENCOUNTER — Ambulatory Visit (INDEPENDENT_AMBULATORY_CARE_PROVIDER_SITE_OTHER): Payer: Managed Care, Other (non HMO) | Admitting: Obstetrics and Gynecology

## 2014-11-28 ENCOUNTER — Encounter: Payer: Self-pay | Admitting: Obstetrics and Gynecology

## 2014-11-28 VITALS — BP 122/76 | HR 70 | Ht 65.0 in

## 2014-11-28 DIAGNOSIS — Z9889 Other specified postprocedural states: Secondary | ICD-10-CM

## 2014-11-28 NOTE — Progress Notes (Signed)
Patient ID: Kelly Glover, female   DOB: 11/27/1950, 64 y.o.   MRN: [id number] GYNECOLOGY  VISIT   HPI: 64 y.o.   Married  Caucasian  female   [phone] with No LMP recorded. Patient has had a hysterectomy.   here for 10 week follow up.  Voids a lot due to diabetes medication.  Has more control over gas now but still has some control issues only if laughs hard.  Taking 1/2 tab of estrogen.  Had breast tenderness when she took the whole tablet.  Stopped vaginal estrogen cream.   Wants to resume all activities.   GYNECOLOGIC HISTORY: No LMP recorded. Patient has had a hysterectomy. Contraception: Hysterectomy   Menopausal hormone therapy: Estradiol 23m,  Estrace vag.cream 1/2 gm twice weekly        OB History    Gravida Para Term Preterm AB TAB SAB Ectopic Multiple Living   4 2 2  2  1 1  2          Patient Active Problem List   Diagnosis Date Noted  . Status post surgery 09/17/2014  . Rectocele 07/19/2014  . Genuine stress incontinence, female 07/19/2014  . Mast cell disorder     Past Medical History  Diagnosis Date  . Diabetes   . History of kidney stones   . History of diverticulitis   . 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 PONV (postoperative nausea and vomiting)     Past Surgical History  Procedure Laterality Date  . Total abdominal hysterectomy w/ bilateral salpingoophorectomy  05/20/98  . Breast biopsy Left ?    benign  . Tonsillectomy  childhood  . Diagnostic laparoscopy    . Rectocele repair N/A 09/17/2014    Procedure: POSTERIOR REPAIR , enterocele repair;  Surgeon: BJamey Reasde CBerton Lan MD;  [facility];  Service: Gynecology;  Laterality: N/A;    Current Outpatient Prescriptions  Medication Sig Dispense Refill  . aspirin EC 81 MG tablet Take 81 mg by mouth daily.    . cetirizine (ZYRTEC)  10 MG tablet Take 10 mg by mouth daily.    . Cholecalciferol (VITAMIN D) 2000 UNITS CAPS Take 1 capsule by mouth daily.    . CRESTOR 20 MG tablet Take 1 tablet by mouth daily.    .Marland Kitchenestradiol (ESTRACE VAGINAL) 0.1 MG/GM vaginal cream Place 1/2 gm per vagina nightly for 2 weeks and then 1/2 gm per vagina twice weekly. 30 g 3  . estradiol (ESTRACE) 2 MG tablet   0  . famotidine (PEPCID AC) 10 MG chewable tablet Chew 10 mg by mouth daily.     .Marland KitchenFOLIC ACID PO Take 4147mcg by mouth daily.     .Marland KitchenJARDIANCE 10 MG TABS Take 1 tablet by mouth daily.    .Marland KitchenNOVOLOG 100 UNIT/ML injection Inject into the skin as directed. Insulin pump    . omeprazole-sodium bicarbonate (ZEGERID) 40-1100 MG per capsule Take 1 capsule by mouth daily before breakfast.    . ONE TOUCH ULTRA TEST test strip     . ramipril (ALTACE) 2.5 MG capsule Take 1 capsule by mouth daily.    . sertraline (ZOLOFT) 100 MG tablet Take 1 tablet by mouth daily.    .Marland KitchenZETIA 10 MG tablet Take 1 tablet by mouth daily.  No current facility-administered medications for this visit.     ALLERGIES: Diflucan; Scallops; Griseofulvin; Percocet; and Doxycycline  Family History  Problem Relation Age of Onset  . Diabetes Mother   . Heart disease Mother   . Heart disease Father   . Thyroid disease Sister   . Asthma Maternal Grandmother 40  . Diabetes Maternal Grandmother   . Diabetes Maternal Grandfather   . Graves' disease Son   . Breast cancer Maternal Grandmother   . Thyroid disease Son     History   Social History  . Marital Status: Married    Spouse Name: N/A  . Number of Children: N/A  . Years of Education: N/A   Occupational History  . Not on file.   Social History Main Topics  . Smoking status: Never Smoker   . Smokeless tobacco: Never Used  . Alcohol Use: No  . Drug Use: No  . Sexual Activity:    Partners: Male    Birth Control/ Protection: Surgical     Comment: hysterectomy   Other Topics Concern  . Not on file    Social History Narrative    ROS:  Pertinent items are noted in HPI.  PHYSICAL EXAMINATION:    BP 122/76 mmHg  Pulse 70  Ht 5' 5"  (1.651 m)  Wt      General appearance: alert, cooperative and appears stated age   Pelvic: External genitalia:  no lesions              Urethra:  normal appearing urethra with no masses, tenderness or lesions              Bartholins and Skenes: normal                 Vagina: normal appearing vagina with normal color and discharge, no lesions              Cervix: absent                   Bimanual Exam:  Uterus:  absent                                      Adnexa:  No masses.                                      ASSESSMENT  Status post enterocele/rectocele repair.  Rectal gas control issues, improved. Doing well.   PLAN  Return to all normal activities.  Resume vaginal estrogen 1/2 gm pv at hs twice weekly.  Cautioned against extreme lifting.  Uses stool softeners. Do Kegels and rectal tightening exercises.  Offered physical therapy.  Currently declines.  Return yearly.    An After Visit Summary was printed and given to the patient.  ____15__ minutes face to face time of which over 50% was spent in counseling.

## 2015-04-01 ENCOUNTER — Other Ambulatory Visit: Payer: Self-pay

## 2015-04-09 DIAGNOSIS — S92355A Nondisplaced fracture of fifth metatarsal bone, left foot, initial encounter for closed fracture: Secondary | ICD-10-CM | POA: Diagnosis not present

## 2015-04-24 DIAGNOSIS — E119 Type 2 diabetes mellitus without complications: Secondary | ICD-10-CM | POA: Diagnosis not present

## 2015-04-30 ENCOUNTER — Encounter: Payer: Self-pay | Admitting: Cardiovascular Disease

## 2015-05-07 DIAGNOSIS — Z4681 Encounter for fitting and adjustment of insulin pump: Secondary | ICD-10-CM | POA: Diagnosis not present

## 2015-05-07 DIAGNOSIS — E1139 Type 2 diabetes mellitus with other diabetic ophthalmic complication: Secondary | ICD-10-CM | POA: Diagnosis not present

## 2015-06-12 DIAGNOSIS — E119 Type 2 diabetes mellitus without complications: Secondary | ICD-10-CM | POA: Diagnosis not present

## 2015-06-25 DIAGNOSIS — Z23 Encounter for immunization: Secondary | ICD-10-CM | POA: Diagnosis not present

## 2015-07-03 ENCOUNTER — Encounter: Payer: Self-pay | Admitting: Internal Medicine

## 2015-07-03 DIAGNOSIS — E785 Hyperlipidemia, unspecified: Secondary | ICD-10-CM | POA: Diagnosis not present

## 2015-07-03 DIAGNOSIS — E119 Type 2 diabetes mellitus without complications: Secondary | ICD-10-CM | POA: Diagnosis not present

## 2015-07-03 DIAGNOSIS — H8110 Benign paroxysmal vertigo, unspecified ear: Secondary | ICD-10-CM | POA: Diagnosis not present

## 2015-07-03 DIAGNOSIS — E560 Deficiency of vitamin E: Secondary | ICD-10-CM | POA: Diagnosis not present

## 2015-07-09 DIAGNOSIS — N200 Calculus of kidney: Secondary | ICD-10-CM | POA: Diagnosis not present

## 2015-07-09 DIAGNOSIS — E785 Hyperlipidemia, unspecified: Secondary | ICD-10-CM | POA: Diagnosis not present

## 2015-07-09 DIAGNOSIS — E1139 Type 2 diabetes mellitus with other diabetic ophthalmic complication: Secondary | ICD-10-CM | POA: Diagnosis not present

## 2015-07-09 DIAGNOSIS — E1165 Type 2 diabetes mellitus with hyperglycemia: Secondary | ICD-10-CM | POA: Diagnosis not present

## 2015-07-09 DIAGNOSIS — M8580 Other specified disorders of bone density and structure, unspecified site: Secondary | ICD-10-CM | POA: Diagnosis not present

## 2015-07-09 DIAGNOSIS — K219 Gastro-esophageal reflux disease without esophagitis: Secondary | ICD-10-CM | POA: Diagnosis not present

## 2015-07-09 DIAGNOSIS — E668 Other obesity: Secondary | ICD-10-CM | POA: Diagnosis not present

## 2015-07-09 DIAGNOSIS — E11319 Type 2 diabetes mellitus with unspecified diabetic retinopathy without macular edema: Secondary | ICD-10-CM | POA: Diagnosis not present

## 2015-07-18 DIAGNOSIS — R05 Cough: Secondary | ICD-10-CM | POA: Diagnosis not present

## 2015-07-18 DIAGNOSIS — J069 Acute upper respiratory infection, unspecified: Secondary | ICD-10-CM | POA: Diagnosis not present

## 2015-07-19 ENCOUNTER — Other Ambulatory Visit: Payer: Self-pay | Admitting: Allergy and Immunology

## 2015-07-19 ENCOUNTER — Ambulatory Visit
Admission: RE | Admit: 2015-07-19 | Discharge: 2015-07-19 | Disposition: A | Discharge status: Home / Self Care | Payer: Medicare Other | Source: Ambulatory Visit | Attending: Allergy and Immunology | Admitting: Allergy and Immunology

## 2015-07-19 DIAGNOSIS — R059 Cough, unspecified: Secondary | ICD-10-CM

## 2015-07-19 DIAGNOSIS — R05 Cough: Secondary | ICD-10-CM | POA: Diagnosis not present

## 2015-08-07 DIAGNOSIS — Z4681 Encounter for fitting and adjustment of insulin pump: Secondary | ICD-10-CM | POA: Diagnosis not present

## 2015-08-07 DIAGNOSIS — E1139 Type 2 diabetes mellitus with other diabetic ophthalmic complication: Secondary | ICD-10-CM | POA: Diagnosis not present

## 2015-10-03 DIAGNOSIS — D485 Neoplasm of uncertain behavior of skin: Secondary | ICD-10-CM | POA: Diagnosis not present

## 2015-10-03 DIAGNOSIS — Z85828 Personal history of other malignant neoplasm of skin: Secondary | ICD-10-CM | POA: Diagnosis not present

## 2015-10-03 DIAGNOSIS — D239 Other benign neoplasm of skin, unspecified: Secondary | ICD-10-CM | POA: Diagnosis not present

## 2015-10-03 DIAGNOSIS — D225 Melanocytic nevi of trunk: Secondary | ICD-10-CM | POA: Diagnosis not present

## 2015-10-03 DIAGNOSIS — L821 Other seborrheic keratosis: Secondary | ICD-10-CM | POA: Diagnosis not present

## 2015-10-03 DIAGNOSIS — L308 Other specified dermatitis: Secondary | ICD-10-CM | POA: Diagnosis not present

## 2015-11-07 DIAGNOSIS — E1139 Type 2 diabetes mellitus with other diabetic ophthalmic complication: Secondary | ICD-10-CM | POA: Diagnosis not present

## 2015-11-07 DIAGNOSIS — Z4681 Encounter for fitting and adjustment of insulin pump: Secondary | ICD-10-CM | POA: Diagnosis not present

## 2015-11-22 DIAGNOSIS — E1139 Type 2 diabetes mellitus with other diabetic ophthalmic complication: Secondary | ICD-10-CM | POA: Diagnosis not present

## 2015-12-02 IMAGING — CR DG CHEST 2V
2 series · 2 of 2 positions shown · non-contrast
Comparison: Two-view chest x-ray 02/22/2009.

CLINICAL DATA: Cough for 2 weeks.  Fever.

EXAM:
CHEST - 2 VIEW

[w chest pa]
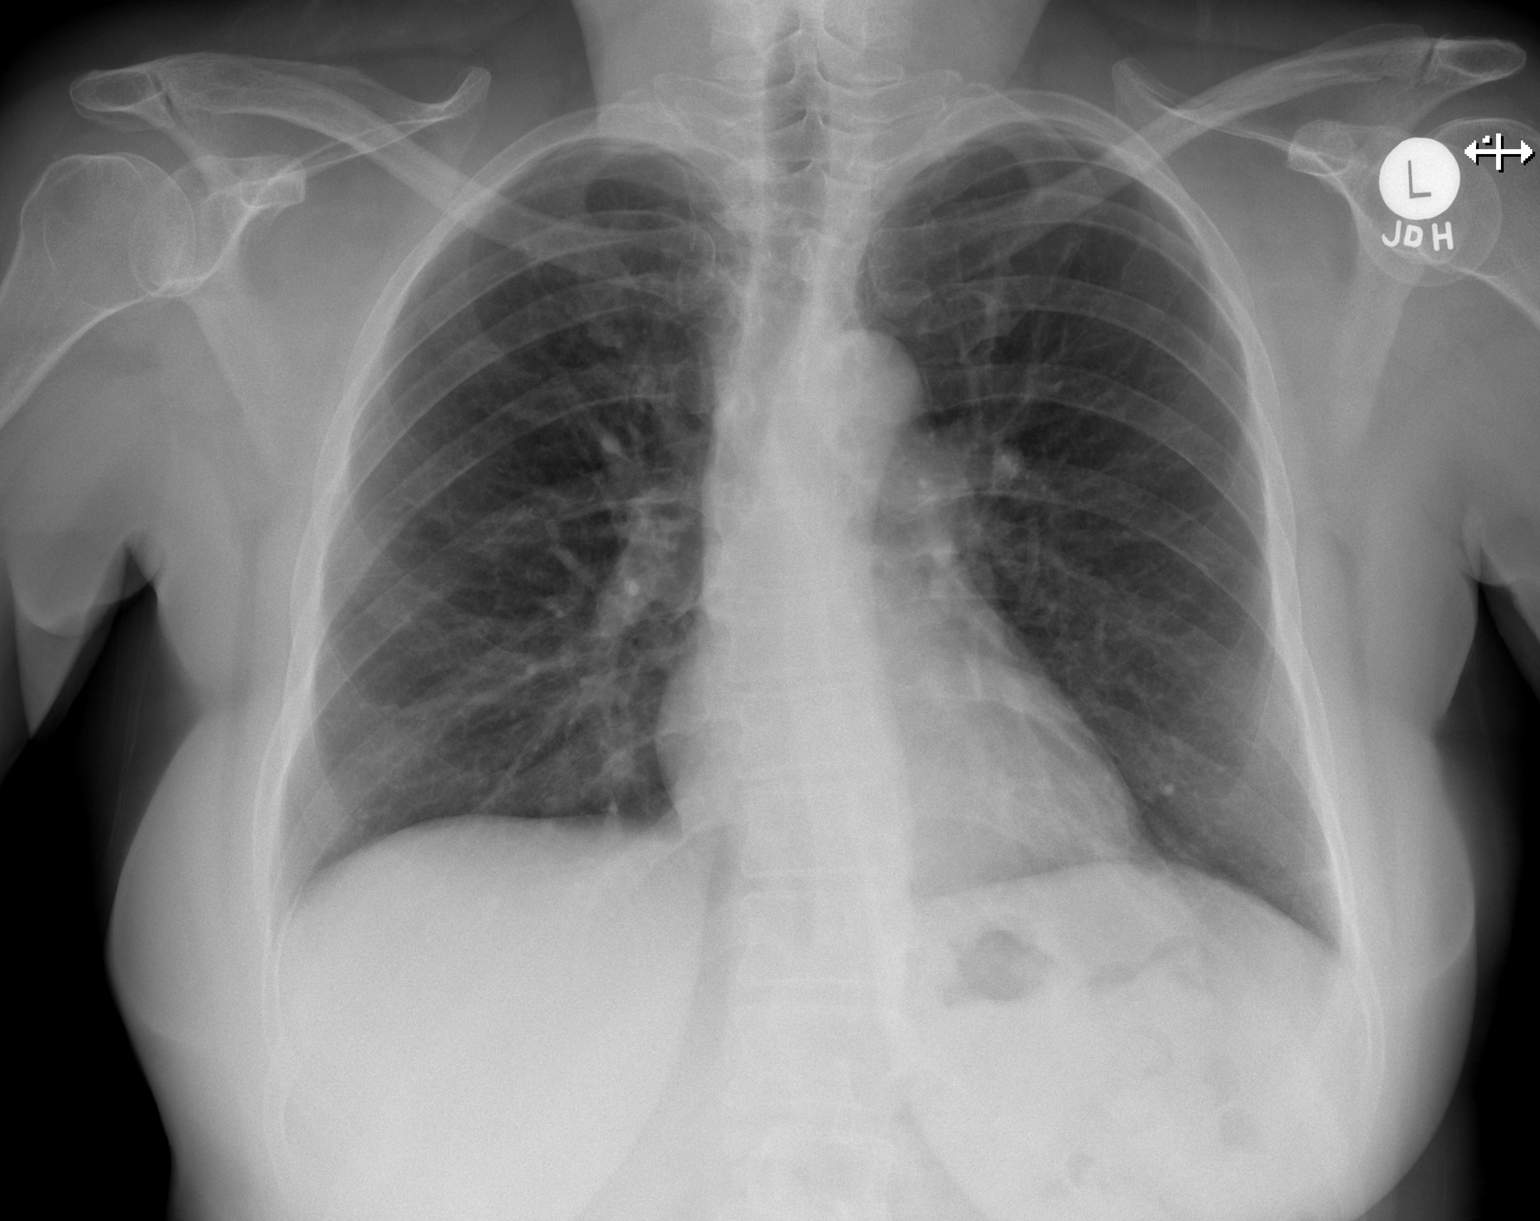

[w chest lat]
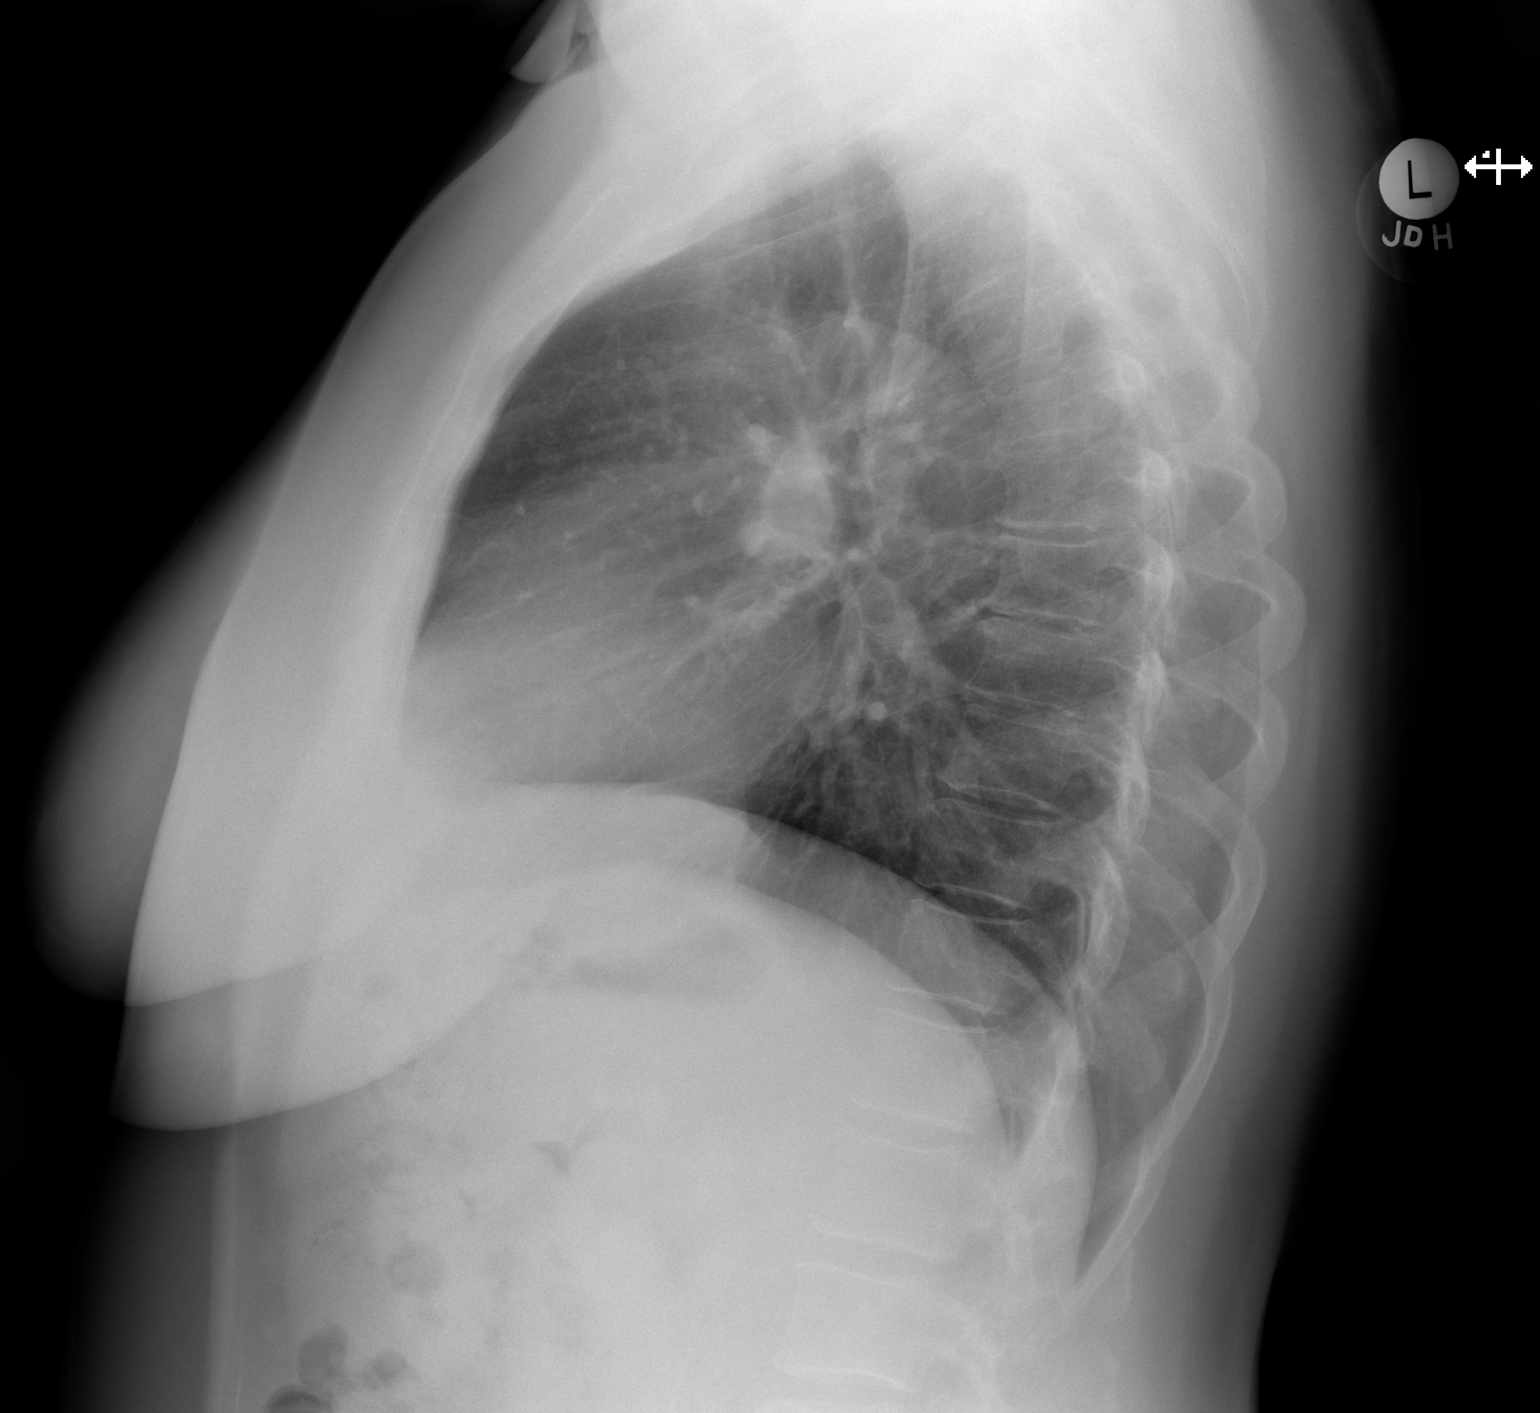

[2 of 2 positions shown; findings below may reference images not displayed]

FINDINGS: The heart size is normal. Lung volumes are low. No focal airspace
disease is evident. The visualized soft tissues and bony thorax are
unremarkable.
IMPRESSION: Negative two view chest x-ray

## 2015-12-04 DIAGNOSIS — Z1389 Encounter for screening for other disorder: Secondary | ICD-10-CM | POA: Diagnosis not present

## 2015-12-04 DIAGNOSIS — N200 Calculus of kidney: Secondary | ICD-10-CM | POA: Diagnosis not present

## 2015-12-04 DIAGNOSIS — E10319 Type 1 diabetes mellitus with unspecified diabetic retinopathy without macular edema: Secondary | ICD-10-CM | POA: Diagnosis not present

## 2015-12-04 DIAGNOSIS — K219 Gastro-esophageal reflux disease without esophagitis: Secondary | ICD-10-CM | POA: Diagnosis not present

## 2015-12-04 DIAGNOSIS — E668 Other obesity: Secondary | ICD-10-CM | POA: Diagnosis not present

## 2015-12-04 DIAGNOSIS — E784 Other hyperlipidemia: Secondary | ICD-10-CM | POA: Diagnosis not present

## 2015-12-04 DIAGNOSIS — E109 Type 1 diabetes mellitus without complications: Secondary | ICD-10-CM | POA: Diagnosis not present

## 2015-12-12 ENCOUNTER — Encounter: Payer: Self-pay | Admitting: Internal Medicine

## 2015-12-12 ENCOUNTER — Ambulatory Visit (INDEPENDENT_AMBULATORY_CARE_PROVIDER_SITE_OTHER): Payer: Medicare Other | Admitting: Internal Medicine

## 2015-12-12 VITALS — BP 126/74 | HR 71 | Ht 65.0 in | Wt 166.0 lb

## 2015-12-12 DIAGNOSIS — R42 Dizziness and giddiness: Secondary | ICD-10-CM

## 2015-12-12 NOTE — Patient Instructions (Addendum)
Your physician recommends that you continue on your current medications as directed. Please refer to the Current Medication list given to you today. Your physician wants you to follow-up in: 1 year with Dr. Jicha.  You will receive a reminder letter in the mail two months in advance. If you don't receive a letter, please call our office to schedule the follow-up appointment.  

## 2015-12-12 NOTE — Progress Notes (Signed)
Cardiology Office Note   Date:  12/12/2015   ID:  RAKIYA Glover, DOB 1951-06-27, MRN [id number]  PCP:  Sheela Stack, MD  Cardiologist:   Dorris Carnes, MD   Chief Complaint  Patient presents with  . Dizziness  . Chest Pain    pressure      History of Present Illness: Kelly Glover is a 65 y.o. female with a history of DM, HL   CT scan in past with mild atheroscleroisis of aorta.  Intermitt CP and palpitations  Had myoview in past  Negative  Echo and event monitor   I saw her in 2015.    In general she says that she is doing very good  Feels good most of time  Past 2 to 3 wks had lightheadedness  Laid down  Body felt heavy   Notes occasional chest  discomfort  Not pain  Comes and goes.  NOt assocated with the dzzy spells Not associated with activity Pt started traiining at [employer]  Has trainer Weights  Treadmill 30 min  3.2  Miles per hour  No symptoms with this   Active otherwise        Outpatient Prescriptions Prior to Visit  Medication Sig Dispense Refill  . aspirin EC 81 MG tablet Take 81 mg by mouth daily.    . cetirizine (ZYRTEC) 10 MG tablet Take 10 mg by mouth daily.    . Cholecalciferol (VITAMIN D) 2000 UNITS CAPS Take 1 capsule by mouth daily.    . CRESTOR 20 MG tablet Take 2 tablets by mouth daily.     Marland Kitchen estradiol (ESTRACE VAGINAL) 0.1 MG/GM vaginal cream Place 1/2 gm per vagina nightly for 2 weeks and then 1/2 gm per vagina twice weekly. 30 g 3  . estradiol (ESTRACE) 2 MG tablet   0  . famotidine (PEPCID AC) 10 MG chewable tablet Chew 10 mg by mouth daily.     Marland Kitchen FOLIC ACID PO Take 884 mcg by mouth daily.     Marland Kitchen JARDIANCE 10 MG TABS Take 1 tablet by mouth daily.    Marland Kitchen NOVOLOG 100 UNIT/ML injection Inject into the skin as directed. Insulin pump    . omeprazole-sodium bicarbonate (ZEGERID) 40-1100 MG per capsule Take 1 capsule by mouth daily before breakfast.    . ONE TOUCH ULTRA TEST test strip     . ramipril (ALTACE) 2.5 MG capsule Take 1 capsule by mouth  daily.    . sertraline (ZOLOFT) 100 MG tablet Take 1 tablet by mouth daily.    Marland Kitchen ZETIA 10 MG tablet Take 1 tablet by mouth daily.     No facility-administered medications prior to visit.     Allergies:   Diflucan; Scallops; Griseofulvin; Percocet; and Doxycycline   Past Medical History  Diagnosis Date  . Diabetes ([place])   . History of kidney stones   . History of diverticulitis   . 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 Neuromuscular disorder ([place])     inappropriate mast cell response disorder   . PONV (postoperative nausea and vomiting)     Past Surgical History  Procedure Laterality Date  . Total abdominal hysterectomy w/ bilateral salpingoophorectomy  05/20/98  . Breast biopsy Left ?    benign  . Tonsillectomy  childhood  . Diagnostic laparoscopy    . Rectocele repair N/A 09/17/2014    Procedure: POSTERIOR REPAIR , enterocele  repair;  Surgeon: Jamey Reas de Berton Lan, MD;  Location: [facility];  Service: Gynecology;  Laterality: N/A;     Social History:  The patient  reports that she has never smoked. She has never used smokeless tobacco. She reports that she does not drink alcohol or use illicit drugs.   Family History:  The patient's family history includes Asthma (age of onset: 35) in her maternal grandmother; Breast cancer in her maternal grandmother; Diabetes in her maternal grandfather, maternal grandmother, and mother; Berenice Primas' disease in her son; Heart disease in her father and mother; Thyroid disease in her sister and son.    ROS:  Please see the history of present illness. All other systems are reviewed and  Negative to the above problem except as noted.    PHYSICAL EXAM: VS:  BP 126/74 mmHg  Pulse 71  Ht 5' 5"  (1.651 m)  Wt 166 lb (75.297 kg)  BMI 27.62 kg/m2  GEN: Well nourished, well developed, in no acute distress HEENT: normal Neck: no JVD, carotid bruits, or  masses Cardiac: RRR; no murmurs, rubs, or gallops,no edema  Respiratory:  clear to auscultation bilaterally, normal work of breathing GI: soft, nontender, nondistended, + BS  No hepatomegaly  MS: no deformity Moving all extremities   Skin: warm and dry, no rash Neuro:  Strength and sensation are intact Psych: euthymic mood, full affect   EKG:  EKG is ordered today.  SR 71 bpm  LVH     Lipid Panel No results found for: CHOL, TRIG, HDL, CHOLHDL, VLDL, LDLCALC, LDLDIRECT    Wt Readings from Last 3 Encounters:  12/12/15 166 lb (75.297 kg)  09/17/14 164 lb (74.39 kg)  09/11/14 164 lb (74.39 kg)      ASSESSMENT AND PLAN:  1  Chest pressure  I do not think it represents coronary ischemia  Atypical  Would follow  2.  Dizziness Intermitt  I have encouraged her to take BP when she has these spells   Encouraged her to stay hydrated  Follow  They have not been that long   2 t o3 wks total  3  HL  Lipids have been good  Now on crestor only due to cost  Keep on this for now  Could , if zetia price comes down try adding 5 mg with 20 crestor      Disposition:   FU with me in 1 year    Signed, Dorris Carnes, MD  12/12/2015 9:34 AM    [address], [place], [place]  [address] Phone: [phone]; Fax: [phone]

## 2016-02-06 DIAGNOSIS — E784 Other hyperlipidemia: Secondary | ICD-10-CM | POA: Diagnosis not present

## 2016-02-06 DIAGNOSIS — E109 Type 1 diabetes mellitus without complications: Secondary | ICD-10-CM | POA: Diagnosis not present

## 2016-02-06 DIAGNOSIS — Z4681 Encounter for fitting and adjustment of insulin pump: Secondary | ICD-10-CM | POA: Diagnosis not present

## 2016-02-25 DIAGNOSIS — Z1231 Encounter for screening mammogram for malignant neoplasm of breast: Secondary | ICD-10-CM | POA: Diagnosis not present

## 2016-04-13 DIAGNOSIS — E784 Other hyperlipidemia: Secondary | ICD-10-CM | POA: Diagnosis not present

## 2016-04-13 DIAGNOSIS — M859 Disorder of bone density and structure, unspecified: Secondary | ICD-10-CM | POA: Diagnosis not present

## 2016-04-13 DIAGNOSIS — E1139 Type 2 diabetes mellitus with other diabetic ophthalmic complication: Secondary | ICD-10-CM | POA: Diagnosis not present

## 2016-04-13 DIAGNOSIS — K219 Gastro-esophageal reflux disease without esophagitis: Secondary | ICD-10-CM | POA: Diagnosis not present

## 2016-04-13 DIAGNOSIS — E11319 Type 2 diabetes mellitus with unspecified diabetic retinopathy without macular edema: Secondary | ICD-10-CM | POA: Diagnosis not present

## 2016-04-13 DIAGNOSIS — R002 Palpitations: Secondary | ICD-10-CM | POA: Diagnosis not present

## 2016-04-13 DIAGNOSIS — E668 Other obesity: Secondary | ICD-10-CM | POA: Diagnosis not present

## 2016-05-28 DIAGNOSIS — E1139 Type 2 diabetes mellitus with other diabetic ophthalmic complication: Secondary | ICD-10-CM | POA: Diagnosis not present

## 2016-05-28 DIAGNOSIS — Z4681 Encounter for fitting and adjustment of insulin pump: Secondary | ICD-10-CM | POA: Diagnosis not present

## 2016-06-29 DIAGNOSIS — E119 Type 2 diabetes mellitus without complications: Secondary | ICD-10-CM | POA: Diagnosis not present

## 2016-07-07 DIAGNOSIS — Z23 Encounter for immunization: Secondary | ICD-10-CM | POA: Diagnosis not present

## 2016-08-18 DIAGNOSIS — E784 Other hyperlipidemia: Secondary | ICD-10-CM | POA: Diagnosis not present

## 2016-08-18 DIAGNOSIS — E109 Type 1 diabetes mellitus without complications: Secondary | ICD-10-CM | POA: Diagnosis not present

## 2016-08-18 DIAGNOSIS — Z4681 Encounter for fitting and adjustment of insulin pump: Secondary | ICD-10-CM | POA: Diagnosis not present

## 2016-08-29 ENCOUNTER — Emergency Department (HOSPITAL_COMMUNITY)
Admission: EM | Admit: 2016-08-29 | Discharge: 2016-08-29 | Disposition: A | Discharge status: Home / Self Care | Payer: Medicare Other | Attending: Emergency Medicine | Admitting: Emergency Medicine

## 2016-08-29 ENCOUNTER — Encounter (HOSPITAL_COMMUNITY): Payer: Self-pay

## 2016-08-29 ENCOUNTER — Emergency Department (HOSPITAL_COMMUNITY): Payer: Medicare Other

## 2016-08-29 DIAGNOSIS — K2970 Gastritis, unspecified, without bleeding: Secondary | ICD-10-CM | POA: Diagnosis not present

## 2016-08-29 DIAGNOSIS — R1011 Right upper quadrant pain: Secondary | ICD-10-CM | POA: Diagnosis not present

## 2016-08-29 DIAGNOSIS — Z7982 Long term (current) use of aspirin: Secondary | ICD-10-CM | POA: Insufficient documentation

## 2016-08-29 DIAGNOSIS — E119 Type 2 diabetes mellitus without complications: Secondary | ICD-10-CM | POA: Diagnosis not present

## 2016-08-29 DIAGNOSIS — E860 Dehydration: Secondary | ICD-10-CM

## 2016-08-29 DIAGNOSIS — Z794 Long term (current) use of insulin: Secondary | ICD-10-CM | POA: Diagnosis not present

## 2016-08-29 DIAGNOSIS — K760 Fatty (change of) liver, not elsewhere classified: Secondary | ICD-10-CM | POA: Diagnosis not present

## 2016-08-29 DIAGNOSIS — R112 Nausea with vomiting, unspecified: Secondary | ICD-10-CM | POA: Diagnosis not present

## 2016-08-29 LAB — CBC WITH DIFFERENTIAL/PLATELET
Basophils Absolute: 0 10*3/uL (ref 0.0–0.1)
Basophils Relative: 0 %
EOS PCT: 1 %
Eosinophils Absolute: 0.1 10*3/uL (ref 0.0–0.7)
HEMATOCRIT: 41.7 % (ref 36.0–46.0)
Hemoglobin: 13.9 g/dL (ref 12.0–15.0)
LYMPHS ABS: 0.6 10*3/uL — AB (ref 0.7–4.0)
LYMPHS PCT: 6 %
MCH: 30.2 pg (ref 26.0–34.0)
MCHC: 33.3 g/dL (ref 30.0–36.0)
MCV: 90.7 fL (ref 78.0–100.0)
MONO ABS: 0.3 10*3/uL (ref 0.1–1.0)
Monocytes Relative: 3 %
NEUTROS ABS: 8.8 10*3/uL — AB (ref 1.7–7.7)
Neutrophils Relative %: 90 %
PLATELETS: 209 10*3/uL (ref 150–400)
RBC: 4.6 MIL/uL (ref 3.87–5.11)
RDW: 12.7 % (ref 11.5–15.5)
WBC: 9.7 10*3/uL (ref 4.0–10.5)

## 2016-08-29 LAB — COMPREHENSIVE METABOLIC PANEL
ALT: 29 U/L (ref 14–54)
AST: 26 U/L (ref 15–41)
Albumin: 3.9 g/dL (ref 3.5–5.0)
Alkaline Phosphatase: 48 U/L (ref 38–126)
Anion gap: 8 (ref 5–15)
BUN: 16 mg/dL (ref 6–20)
CO2: 23 mmol/L (ref 22–32)
Calcium: 8.8 mg/dL — ABNORMAL LOW (ref 8.9–10.3)
Chloride: 107 mmol/L (ref 101–111)
Creatinine, Ser: 0.7 mg/dL (ref 0.44–1.00)
GFR calc Af Amer: >60 mL/min (ref >60)
Glucose, Bld: 217 mg/dL — ABNORMAL HIGH (ref 65–99)
POTASSIUM: 3.9 mmol/L (ref 3.5–5.1)
Sodium: 138 mmol/L (ref 135–145)
TOTAL PROTEIN: 6.6 g/dL (ref 6.5–8.1)
Total Bilirubin: 0.9 mg/dL (ref 0.3–1.2)

## 2016-08-29 LAB — URINE MICROSCOPIC-ADD ON
RBC / HPF: NONE SEEN RBC/hpf (ref 0–5)
WBC, UA: NONE SEEN WBC/hpf (ref 0–5)

## 2016-08-29 LAB — CBG MONITORING, ED: Glucose-Capillary: 190 mg/dL — ABNORMAL HIGH (ref 65–99)

## 2016-08-29 LAB — I-STAT TROPONIN, ED: Troponin i, poc: 0 ng/mL (ref 0.00–0.08)

## 2016-08-29 LAB — URINALYSIS, ROUTINE W REFLEX MICROSCOPIC
Bilirubin Urine: NEGATIVE
HGB URINE DIPSTICK: NEGATIVE
Ketones, ur: 40 mg/dL — AB
Leukocytes, UA: NEGATIVE
Nitrite: NEGATIVE
PH: 8 (ref 5.0–8.0)
PROTEIN: NEGATIVE mg/dL
Specific Gravity, Urine: 1.027 (ref 1.005–1.030)

## 2016-08-29 LAB — LIPASE, BLOOD: Lipase: 17 U/L (ref 11–51)

## 2016-08-29 MED ORDER — SODIUM CHLORIDE 0.9 % IV BOLUS (SEPSIS)
1000.0000 mL | Freq: Once | INTRAVENOUS | Status: AC
  Administered 2016-08-29: 1000 mL via INTRAVENOUS

## 2016-08-29 MED ORDER — ACETAMINOPHEN 500 MG PO TABS
1000.0000 mg | ORAL_TABLET | Freq: Once | ORAL | Status: AC
  Administered 2016-08-29: 1000 mg via ORAL
  Filled 2016-08-29: qty 2

## 2016-08-29 MED ORDER — ONDANSETRON HCL 4 MG PO TABS: 4.0000 mg | ORAL_TABLET | Freq: Four times a day (QID) | ORAL | 0 refills | Status: DC

## 2016-08-29 MED ORDER — MORPHINE SULFATE (PF) 4 MG/ML IV SOLN
4.0000 mg | Freq: Once | INTRAVENOUS | Status: AC
  Administered 2016-08-29: 4 mg via INTRAVENOUS
  Filled 2016-08-29: qty 1

## 2016-08-29 MED ORDER — ONDANSETRON HCL 4 MG/2ML IJ SOLN
4.0000 mg | Freq: Once | INTRAMUSCULAR | Status: AC
  Administered 2016-08-29: 4 mg via INTRAVENOUS
  Filled 2016-08-29: qty 2

## 2016-08-29 NOTE — ED Notes (Signed)
Bed: FU93 Expected date:  Expected time:  Means of arrival:  Comments: EMS N/V

## 2016-08-29 NOTE — ED Triage Notes (Signed)
She reports numerous episodes of emesis today. She received 39m of IV Zofran en route to hospital.

## 2016-08-29 NOTE — ED Provider Notes (Signed)
[facility] DEPT Provider Note   CSN: [id number] Arrival date & time: 08/29/16  1510     History   Chief Complaint Chief Complaint  Patient presents with  . Emesis    HPI Kelly Glover is a 65 y.o. female.  Patient with PMH of DM, mast cell disorder, presents to the ED with a chief complaint of nausea, vomiting, and abdominal cramps.  She states that the symptoms started this morning.  She states that she has not been able to keep anything down.  She states that she has not taken anything for her symptoms.  She denies any fevers, chills, chest pain, dysuria, or hematuria.  She states that she felt like she couldn't catch her breath earlier today when she was vomiting.  There are no other associated symptoms.   The history is provided by the patient. No language interpreter was used.    Past Medical History:  Diagnosis Date  . Diabetes ([place])   . History of diverticulitis   . History of kidney stones   . 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 Neuromuscular disorder ([place])    inappropriate mast cell response disorder   . PONV (postoperative nausea and vomiting)   . Squamous cell carcinoma of left lower leg 2015    Patient Active Problem List   Diagnosis Date Noted  . Status post surgery 09/17/2014  . Rectocele 07/19/2014  . Genuine stress incontinence, female 07/19/2014  . Mast cell disorder     Past Surgical History:  Procedure Laterality Date  . BREAST BIOPSY Left ?   benign  . DIAGNOSTIC LAPAROSCOPY    . RECTOCELE REPAIR N/A 09/17/2014   Procedure: POSTERIOR REPAIR , enterocele repair;  Surgeon: Jamey Reas de Berton Lan, MD;  Location: [facility];  Service: Gynecology;  Laterality: N/A;  . TONSILLECTOMY  childhood  . TOTAL ABDOMINAL HYSTERECTOMY W/ BILATERAL SALPINGOOPHORECTOMY  05/20/98    OB History    Gravida Para Term Preterm AB Living   4 2 2   2 2    SAB TAB Ectopic Multiple Live Births   1   1   2        Home  Medications    Prior to Admission medications   Medication Sig Start Date End Date Taking? Authorizing Provider  aspirin EC 81 MG tablet Take 81 mg by mouth daily.    Historical Provider, MD  cetirizine (ZYRTEC) 10 MG tablet Take 10 mg by mouth daily.    Historical Provider, MD  Cholecalciferol (VITAMIN D) 2000 UNITS CAPS Take 1 capsule by mouth daily.    Historical Provider, MD  CRESTOR 20 MG tablet Take 2 tablets by mouth daily.  06/27/14   Historical Provider, MD  estradiol (ESTRACE VAGINAL) 0.1 MG/GM vaginal cream Place 1/2 gm per vagina nightly for 2 weeks and then 1/2 gm per vagina twice weekly. 10/17/14   Nunzio Cobbs, MD  estradiol (ESTRACE) 2 MG tablet  08/23/14   Historical Provider, MD  famotidine (PEPCID AC) 10 MG chewable tablet Chew 10 mg by mouth daily.     Historical Provider, MD  FOLIC ACID PO Take 944 mcg by mouth daily.     Historical Provider, MD  JARDIANCE 10 MG TABS Take 1 tablet by mouth daily. 06/24/14   Historical Provider, MD  NOVOLOG 100 UNIT/ML injection Inject into the skin as directed. Insulin pump 06/25/14   Historical Provider, MD  omeprazole-sodium bicarbonate (ZEGERID) 40-1100 MG per  capsule Take 1 capsule by mouth daily before breakfast.    Historical Provider, MD  ONE TOUCH ULTRA TEST test strip  06/24/14   Historical Provider, MD  ramipril (ALTACE) 2.5 MG capsule Take 1 capsule by mouth daily. 06/27/14   Historical Provider, MD  sertraline (ZOLOFT) 100 MG tablet Take 1 tablet by mouth daily. 06/24/14   Historical Provider, MD    Family History Family History  Problem Relation Age of Onset  . Diabetes Mother   . Heart disease Mother   . Heart disease Father   . Thyroid disease Sister   . Asthma Maternal Grandmother 46  . Diabetes Maternal Grandmother   . Breast cancer Maternal Grandmother   . Diabetes Maternal Grandfather   . Graves' disease Son   . Thyroid disease Son     Social History Social History  Substance Use Topics  . Smoking  status: Never Smoker  . Smokeless tobacco: Never Used  . Alcohol use No     Allergies   Diflucan [fluconazole]; Scallops [shellfish allergy]; Griseofulvin; Percocet [oxycodone-acetaminophen]; and Doxycycline   Review of Systems Review of Systems  Gastrointestinal: Positive for nausea and vomiting.  All other systems reviewed and are negative.    Physical Exam Updated Vital Signs BP 130/59 (BP Location: Left Arm)   Pulse 83   Temp 99.1 F (37.3 C) (Oral)   Resp 18   SpO2 98%   Physical Exam  Constitutional: She is oriented to person, place, and time. She appears well-developed and well-nourished.  HENT:  Head: Normocephalic and atraumatic.  Eyes: Conjunctivae and EOM are normal. Pupils are equal, round, and reactive to light.  Neck: Normal range of motion. Neck supple.  Cardiovascular: Normal rate and regular rhythm.  Exam reveals no gallop and no friction rub.   No murmur heard. Pulmonary/Chest: Effort normal and breath sounds normal. No respiratory distress. She has no wheezes. She has no rales. She exhibits no tenderness.  Abdominal: Soft. Bowel sounds are normal. She exhibits no distension and no mass. There is no tenderness. There is no rebound and no guarding.  No focal abdominal tenderness, no RLQ tenderness or pain at McBurney's point, no RUQ tenderness or Murphy's sign, no left-sided abdominal tenderness, no fluid wave, or signs of peritonitis   Musculoskeletal: Normal range of motion. She exhibits no edema or tenderness.  Neurological: She is alert and oriented to person, place, and time.  Skin: Skin is warm and dry.  Psychiatric: She has a normal mood and affect. Her behavior is normal. Judgment and thought content normal.  Nursing note and vitals reviewed.    ED Treatments / Results  Labs (all labs ordered are listed, but only abnormal results are displayed) Labs Reviewed  CBC WITH DIFFERENTIAL/PLATELET  URINALYSIS, ROUTINE W REFLEX MICROSCOPIC (NOT AT  [facility])  LIPASE, BLOOD  COMPREHENSIVE METABOLIC PANEL  CBG MONITORING, ED  I-STAT TROPOININ, ED    EKG  EKG Interpretation None       Radiology No results found.  Procedures Procedures (including critical care time)  Medications Ordered in ED Medications  ondansetron (ZOFRAN) injection 4 mg (not administered)  morphine 4 MG/ML injection 4 mg (not administered)     Initial Impression / Assessment and Plan / ED Course  I have reviewed the triage vital signs and the nursing notes.  Pertinent labs & imaging results that were available during my care of the patient were reviewed by me and considered in my medical decision making (see chart for details).  Clinical  Course     Patient with nausea and vomiting since yesterday. She is diabetic, but does not appear to be in DKA. Anion gap is 8. We'll continue with fluids and antiemetics.  Patient seen by and discussed Dr. Ellender Hose, who also recommends adding right upper quadrant ultrasound.  LFTs are negative, but patient does have some tenderness to palpation and still has her gallbladder.  Right upper quadrant also is negative. Patient is feeling better. Will discharged home with primary care follow-up. Final Clinical Impressions(s) / ED Diagnoses   Final diagnoses:  RUQ pain  Non-intractable vomiting with nausea, unspecified vomiting type  Dehydration    New Prescriptions New Prescriptions   No medications on file     Montine Circle, PA-C 08/30/16 [place], MD 08/30/16 [phone]

## 2016-08-29 NOTE — ED Notes (Signed)
Awaiting fluids to complete then pt to be discharged.

## 2016-10-05 HISTORY — PX: COLONOSCOPY: SHX174

## 2016-10-09 DIAGNOSIS — Z23 Encounter for immunization: Secondary | ICD-10-CM | POA: Diagnosis not present

## 2016-10-09 DIAGNOSIS — Z808 Family history of malignant neoplasm of other organs or systems: Secondary | ICD-10-CM | POA: Diagnosis not present

## 2016-10-09 DIAGNOSIS — L821 Other seborrheic keratosis: Secondary | ICD-10-CM | POA: Diagnosis not present

## 2016-10-09 DIAGNOSIS — Z85828 Personal history of other malignant neoplasm of skin: Secondary | ICD-10-CM | POA: Diagnosis not present

## 2016-10-09 DIAGNOSIS — D225 Melanocytic nevi of trunk: Secondary | ICD-10-CM | POA: Diagnosis not present

## 2016-10-09 DIAGNOSIS — K143 Hypertrophy of tongue papillae: Secondary | ICD-10-CM | POA: Diagnosis not present

## 2016-10-21 DIAGNOSIS — E785 Hyperlipidemia, unspecified: Secondary | ICD-10-CM | POA: Diagnosis not present

## 2016-10-21 DIAGNOSIS — E784 Other hyperlipidemia: Secondary | ICD-10-CM | POA: Diagnosis not present

## 2016-10-21 DIAGNOSIS — E668 Other obesity: Secondary | ICD-10-CM | POA: Diagnosis not present

## 2016-10-21 DIAGNOSIS — E109 Type 1 diabetes mellitus without complications: Secondary | ICD-10-CM | POA: Diagnosis not present

## 2016-10-21 DIAGNOSIS — Z1389 Encounter for screening for other disorder: Secondary | ICD-10-CM | POA: Diagnosis not present

## 2016-10-21 DIAGNOSIS — Z23 Encounter for immunization: Secondary | ICD-10-CM | POA: Diagnosis not present

## 2016-10-21 DIAGNOSIS — K219 Gastro-esophageal reflux disease without esophagitis: Secondary | ICD-10-CM | POA: Diagnosis not present

## 2016-11-02 ENCOUNTER — Other Ambulatory Visit: Payer: Self-pay | Admitting: Endocrinology

## 2016-11-02 DIAGNOSIS — M8580 Other specified disorders of bone density and structure, unspecified site: Secondary | ICD-10-CM

## 2016-11-03 ENCOUNTER — Ambulatory Visit
Admission: RE | Admit: 2016-11-03 | Discharge: 2016-11-03 | Disposition: A | Discharge status: Home / Self Care | Payer: Medicare Other | Source: Ambulatory Visit | Attending: Endocrinology | Admitting: Endocrinology

## 2016-11-03 DIAGNOSIS — M8589 Other specified disorders of bone density and structure, multiple sites: Secondary | ICD-10-CM | POA: Diagnosis not present

## 2016-11-03 DIAGNOSIS — S92515A Nondisplaced fracture of proximal phalanx of left lesser toe(s), initial encounter for closed fracture: Secondary | ICD-10-CM | POA: Diagnosis not present

## 2016-11-03 DIAGNOSIS — M8580 Other specified disorders of bone density and structure, unspecified site: Secondary | ICD-10-CM

## 2016-11-03 DIAGNOSIS — Z780 Asymptomatic menopausal state: Secondary | ICD-10-CM | POA: Diagnosis not present

## 2016-11-04 DIAGNOSIS — L980 Pyogenic granuloma: Secondary | ICD-10-CM | POA: Diagnosis not present

## 2016-11-04 DIAGNOSIS — K148 Other diseases of tongue: Secondary | ICD-10-CM | POA: Diagnosis not present

## 2016-11-17 DIAGNOSIS — E109 Type 1 diabetes mellitus without complications: Secondary | ICD-10-CM | POA: Diagnosis not present

## 2016-11-17 DIAGNOSIS — Z4681 Encounter for fitting and adjustment of insulin pump: Secondary | ICD-10-CM | POA: Diagnosis not present

## 2016-11-17 NOTE — Progress Notes (Signed)
Cardiology Office Note   Date:  11/19/2016   ID:  Kelly Glover, Kelly Glover [birth year], MRN [id number]  PCP:  Sheela Stack, MD  Cardiologist:   Dorris Carnes, MD    F/U of chest tightness and near syncope     History of Present Illness: Kelly Glover is a 66 y.o. female with a history of DM, HL.    Hx of CP and dizziness and palpitations CT scan showed atherosclerosis of aorta.  Myoviue neg in past  Alos had intermitt CP and palpitations    I saw the pt in March 2017  Since seen had a coupole episodes of near syncope  Gets confused, weak   One syncope   Incoherent  More recently     Had emesis x 3  Couldn't walk  Confused  Combative  Pulse fine  7/10 with position change  Hx of Mast cell activation syndrome    Usu with these spells they are prolong diarrhea / vomiting  Spells last longer   Discomfort in chest  Not associated with activity    Started going back to gym yesterday  No discomfort  Has not been there for awhile      Current Meds  Medication Sig  . aspirin EC 81 MG tablet Take 81 mg by mouth daily.  . cetirizine (ZYRTEC) 10 MG tablet Take 10 mg by mouth daily.  . Cholecalciferol (VITAMIN D) 2000 UNITS CAPS Take 1 capsule by mouth daily.  . CRESTOR 20 MG tablet Take 2 tablets by mouth daily.   Marland Kitchen estradiol (ESTRACE VAGINAL) 0.1 MG/GM vaginal cream Place 1/2 gm per vagina nightly for 2 weeks and then 1/2 gm per vagina twice weekly.  Marland Kitchen estradiol (ESTRACE) 2 MG tablet   . famotidine (PEPCID AC) 10 MG chewable tablet Chew 10 mg by mouth daily.   Marland Kitchen FOLIC ACID PO Take 175 mcg by mouth daily.   Marland Kitchen JARDIANCE 10 MG TABS Take 1 tablet by mouth daily.  Marland Kitchen NOVOLOG 100 UNIT/ML injection Inject into the skin as directed. Insulin pump  . omeprazole-sodium bicarbonate (ZEGERID) 40-1100 MG per capsule Take 1 capsule by mouth daily before breakfast.  . ondansetron (ZOFRAN) 4 MG tablet Take 1 tablet (4 mg total) by mouth every 6 (six) hours.  . ONE TOUCH ULTRA TEST test strip   .  ramipril (ALTACE) 2.5 MG capsule Take 1 capsule by mouth daily.  . sertraline (ZOLOFT) 100 MG tablet Take 50 mg by mouth daily.      Allergies:   Diflucan [fluconazole]; Scallops [shellfish allergy]; Griseofulvin; Percocet [oxycodone-acetaminophen]; and Doxycycline   Past Medical History:  Diagnosis Date  . Diabetes ([place])   . History of diverticulitis   . History of kidney stones   . 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 PONV (postoperative nausea and vomiting)   . Squamous cell carcinoma of left lower leg 2015    Past Surgical History:  Procedure Laterality Date  . BREAST BIOPSY Left ?   benign  . DIAGNOSTIC LAPAROSCOPY    . RECTOCELE REPAIR N/A 09/17/2014   Procedure: POSTERIOR REPAIR , enterocele repair;  Surgeon: Jamey Reas de Berton Lan, MD;  Location: [facility];  Service: Gynecology;  Laterality: N/A;  . TONSILLECTOMY  childhood  . TOTAL ABDOMINAL HYSTERECTOMY W/ BILATERAL SALPINGOOPHORECTOMY  05/20/98     Social History:  The patient  reports that  she has never smoked. She has never used smokeless tobacco. She reports that she does not drink alcohol or use drugs.   Family History:  The patient's family history includes Asthma (age of onset: 25) in her maternal grandmother; Breast cancer in her maternal grandmother; Diabetes in her maternal grandfather, maternal grandmother, and mother; Berenice Primas' disease in her son; Heart disease in her father and mother; Thyroid disease in her sister and son.    ROS:  Please see the history of present illness. All other systems are reviewed and  Negative to the above problem except as noted.    PHYSICAL EXAM: VS:  BP 140/80   Pulse 84   Ht 5' 6"  (1.676 m)   SpO2 95%   GEN: Well nourished, well developed, in no acute distress  HEENT: normal  Neck: no JVD, carotid bruits, or masses Cardiac: RRR; no murmurs, rubs, or gallops,no  edema  Respiratory:  clear to auscultation bilaterally, normal work of breathing GI: soft, nontender, nondistended, + BS  No hepatomegaly  MS: no deformity Moving all extremities   Skin: warm and dry, no rash Neuro:  Strength and sensation are intact Psych: euthymic mood, full affect   EKG:  EKG is not ordered today.   Lipid Panel No results found for: CHOL, TRIG, HDL, CHOLHDL, VLDL, LDLCALC, LDLDIRECT    Wt Readings from Last 3 Encounters:  08/29/16 180 lb (81.6 kg)  12/12/15 166 lb (75.3 kg)  09/17/14 164 lb (74.4 kg)      ASSESSMENT AND PLAN:  1  CP  Atypical  I am not convinced cardiac in origin  2  Dizziness  I am not sure what these represent  Glu OK  HR after spell OK  She is not orthostatic today  Symptoms are erratic    I would set up with event monitor  Will review with [facility] [place]    3  HL  COntinue Crestor     Current medicines are reviewed at length with the patient today.  The patient does not have concerns regarding medicines.  Signed, Dorris Carnes, MD  11/19/2016 8:25 AM    [facility] [address], [place], [place]  [address] Phone: [phone]; Fax: [phone]

## 2016-11-19 ENCOUNTER — Encounter: Payer: Self-pay | Admitting: Internal Medicine

## 2016-11-19 ENCOUNTER — Ambulatory Visit (INDEPENDENT_AMBULATORY_CARE_PROVIDER_SITE_OTHER): Payer: Medicare Other | Admitting: Internal Medicine

## 2016-11-19 VITALS — BP 140/80 | HR 84 | Ht 66.0 in

## 2016-11-19 DIAGNOSIS — R42 Dizziness and giddiness: Secondary | ICD-10-CM

## 2016-11-19 NOTE — Patient Instructions (Signed)
Your physician recommends that you continue on your current medications as directed. Please refer to the Current Medication list given to you today.  Your physician has recommended that you wear an event monitor. Event monitors are medical devices that record the heart's electrical activity. Doctors most often [place] these monitors to diagnose arrhythmias. Arrhythmias are problems with the speed or rhythm of the heartbeat. The monitor is a small, portable device. You can wear one while you do your normal daily activities. This is usually used to diagnose what is causing palpitations/syncope (passing out).

## 2016-11-24 ENCOUNTER — Ambulatory Visit (INDEPENDENT_AMBULATORY_CARE_PROVIDER_SITE_OTHER): Payer: Medicare Other

## 2016-11-24 DIAGNOSIS — R42 Dizziness and giddiness: Secondary | ICD-10-CM

## 2016-12-30 DIAGNOSIS — Z4681 Encounter for fitting and adjustment of insulin pump: Secondary | ICD-10-CM | POA: Diagnosis not present

## 2016-12-30 DIAGNOSIS — E1139 Type 2 diabetes mellitus with other diabetic ophthalmic complication: Secondary | ICD-10-CM | POA: Diagnosis not present

## 2017-01-11 ENCOUNTER — Telehealth: Payer: Self-pay | Admitting: Internal Medicine

## 2017-01-11 NOTE — Telephone Encounter (Signed)
Notes recorded by Fay Records, MD on 01/01/2017 at 5:34 PM EDT Left msg on VM Left msg for her to contact us with how she is doing  Described results of holter  It did not appear that she had any "spells" while she wore it  I have forwarded records to [place] would not plan further tests for now          Spoke with patient.  She did not receive the voicemail left by Dr. Harrington Challenger.   At her request I reviewed results with patient's husband, Dr. Harrington Challenger.  He verbalizes understanding that patient is to call back if she experiences any further "spells".

## 2017-01-11 NOTE — Telephone Encounter (Signed)
New message    Pt is calling for the results of her 30 day heart monitor.

## 2017-01-26 DIAGNOSIS — L980 Pyogenic granuloma: Secondary | ICD-10-CM | POA: Diagnosis not present

## 2017-01-26 DIAGNOSIS — D101 Benign neoplasm of tongue: Secondary | ICD-10-CM | POA: Diagnosis not present

## 2017-03-02 DIAGNOSIS — Z6826 Body mass index (BMI) 26.0-26.9, adult: Secondary | ICD-10-CM | POA: Diagnosis not present

## 2017-03-02 DIAGNOSIS — Z1389 Encounter for screening for other disorder: Secondary | ICD-10-CM | POA: Diagnosis not present

## 2017-03-02 DIAGNOSIS — E784 Other hyperlipidemia: Secondary | ICD-10-CM | POA: Diagnosis not present

## 2017-03-02 DIAGNOSIS — N200 Calculus of kidney: Secondary | ICD-10-CM | POA: Diagnosis not present

## 2017-03-02 DIAGNOSIS — M8580 Other specified disorders of bone density and structure, unspecified site: Secondary | ICD-10-CM | POA: Diagnosis not present

## 2017-03-02 DIAGNOSIS — E11319 Type 2 diabetes mellitus with unspecified diabetic retinopathy without macular edema: Secondary | ICD-10-CM | POA: Diagnosis not present

## 2017-03-02 DIAGNOSIS — K219 Gastro-esophageal reflux disease without esophagitis: Secondary | ICD-10-CM | POA: Diagnosis not present

## 2017-04-13 DIAGNOSIS — Z4681 Encounter for fitting and adjustment of insulin pump: Secondary | ICD-10-CM | POA: Diagnosis not present

## 2017-04-13 DIAGNOSIS — E1139 Type 2 diabetes mellitus with other diabetic ophthalmic complication: Secondary | ICD-10-CM | POA: Diagnosis not present

## 2017-05-20 DIAGNOSIS — L820 Inflamed seborrheic keratosis: Secondary | ICD-10-CM | POA: Diagnosis not present

## 2017-05-20 DIAGNOSIS — D2271 Melanocytic nevi of right lower limb, including hip: Secondary | ICD-10-CM | POA: Diagnosis not present

## 2017-05-20 DIAGNOSIS — Z808 Family history of malignant neoplasm of other organs or systems: Secondary | ICD-10-CM | POA: Diagnosis not present

## 2017-05-20 DIAGNOSIS — D225 Melanocytic nevi of trunk: Secondary | ICD-10-CM | POA: Diagnosis not present

## 2017-05-20 DIAGNOSIS — Z85828 Personal history of other malignant neoplasm of skin: Secondary | ICD-10-CM | POA: Diagnosis not present

## 2017-06-26 ENCOUNTER — Encounter (HOSPITAL_COMMUNITY): Payer: Self-pay | Admitting: Emergency Medicine

## 2017-06-26 ENCOUNTER — Emergency Department (HOSPITAL_COMMUNITY)
Admission: EM | Admit: 2017-06-26 | Discharge: 2017-06-26 | Disposition: A | Discharge status: Home / Self Care | Payer: Medicare Other | Attending: Emergency Medicine | Admitting: Emergency Medicine

## 2017-06-26 ENCOUNTER — Emergency Department (HOSPITAL_COMMUNITY): Payer: Medicare Other

## 2017-06-26 DIAGNOSIS — E119 Type 2 diabetes mellitus without complications: Secondary | ICD-10-CM | POA: Insufficient documentation

## 2017-06-26 DIAGNOSIS — Z7982 Long term (current) use of aspirin: Secondary | ICD-10-CM | POA: Insufficient documentation

## 2017-06-26 DIAGNOSIS — R1032 Left lower quadrant pain: Secondary | ICD-10-CM | POA: Diagnosis present

## 2017-06-26 DIAGNOSIS — K5732 Diverticulitis of large intestine without perforation or abscess without bleeding: Secondary | ICD-10-CM | POA: Diagnosis not present

## 2017-06-26 DIAGNOSIS — Z79899 Other long term (current) drug therapy: Secondary | ICD-10-CM | POA: Diagnosis not present

## 2017-06-26 DIAGNOSIS — Z7984 Long term (current) use of oral hypoglycemic drugs: Secondary | ICD-10-CM | POA: Diagnosis not present

## 2017-06-26 DIAGNOSIS — K5792 Diverticulitis of intestine, part unspecified, without perforation or abscess without bleeding: Secondary | ICD-10-CM | POA: Diagnosis not present

## 2017-06-26 LAB — COMPREHENSIVE METABOLIC PANEL
ALBUMIN: 4.2 g/dL (ref 3.5–5.0)
ALT: 26 U/L (ref 14–54)
ANION GAP: 10 (ref 5–15)
AST: 18 U/L (ref 15–41)
Alkaline Phosphatase: 75 U/L (ref 38–126)
BILIRUBIN TOTAL: 0.7 mg/dL (ref 0.3–1.2)
BUN: 17 mg/dL (ref 6–20)
CO2: 25 mmol/L (ref 22–32)
Calcium: 9.7 mg/dL (ref 8.9–10.3)
Chloride: 102 mmol/L (ref 101–111)
Creatinine, Ser: 0.74 mg/dL (ref 0.44–1.00)
GFR calc non Af Amer: >60 mL/min (ref >60)
GLUCOSE: 171 mg/dL — AB (ref 65–99)
POTASSIUM: 4.2 mmol/L (ref 3.5–5.1)
SODIUM: 137 mmol/L (ref 135–145)
Total Protein: 7.7 g/dL (ref 6.5–8.1)

## 2017-06-26 LAB — URINALYSIS, ROUTINE W REFLEX MICROSCOPIC
BACTERIA UA: NONE SEEN
Bilirubin Urine: NEGATIVE
Glucose, UA: >=500 mg/dL — AB
Hgb urine dipstick: NEGATIVE
KETONES UR: 5 mg/dL — AB
LEUKOCYTES UA: NEGATIVE
Nitrite: NEGATIVE
PROTEIN: NEGATIVE mg/dL
Specific Gravity, Urine: 1.018 (ref 1.005–1.030)
pH: 8 (ref 5.0–8.0)

## 2017-06-26 LAB — CBC
HEMATOCRIT: 43.2 % (ref 36.0–46.0)
Hemoglobin: 14.7 g/dL (ref 12.0–15.0)
MCH: 30.7 pg (ref 26.0–34.0)
MCHC: 34 g/dL (ref 30.0–36.0)
MCV: 90.2 fL (ref 78.0–100.0)
Platelets: 225 10*3/uL (ref 150–400)
RBC: 4.79 MIL/uL (ref 3.87–5.11)
RDW: 12.5 % (ref 11.5–15.5)
WBC: 14.4 10*3/uL — AB (ref 4.0–10.5)

## 2017-06-26 LAB — LIPASE, BLOOD: Lipase: 19 U/L (ref 11–51)

## 2017-06-26 MED ORDER — AMOXICILLIN-POT CLAVULANATE 875-125 MG PO TABS: 1.0000 | ORAL_TABLET | Freq: Two times a day (BID) | ORAL | 1 refills | Status: DC

## 2017-06-26 MED ORDER — METRONIDAZOLE IN NACL 5-0.79 MG/ML-% IV SOLN
500.0000 mg | Freq: Once | INTRAVENOUS | Status: AC
  Administered 2017-06-26: 500 mg via INTRAVENOUS
  Filled 2017-06-26: qty 100

## 2017-06-26 MED ORDER — ONDANSETRON HCL 4 MG/2ML IJ SOLN
4.0000 mg | Freq: Once | INTRAMUSCULAR | Status: AC
  Administered 2017-06-26: 4 mg via INTRAVENOUS
  Filled 2017-06-26: qty 2

## 2017-06-26 MED ORDER — IOPAMIDOL (ISOVUE-300) INJECTION 61%
INTRAVENOUS | Status: AC
  Filled 2017-06-26: qty 100

## 2017-06-26 MED ORDER — AMPICILLIN-SULBACTAM SODIUM 3 (2-1) G IJ SOLR
3.0000 g | Freq: Once | INTRAMUSCULAR | Status: AC
  Administered 2017-06-26: 3 g via INTRAVENOUS
  Filled 2017-06-26: qty 3

## 2017-06-26 MED ORDER — ONDANSETRON 4 MG PO TBDP
4.0000 mg | ORAL_TABLET | Freq: Once | ORAL | Status: AC | PRN
  Administered 2017-06-26: 4 mg via ORAL
  Filled 2017-06-26: qty 1

## 2017-06-26 MED ORDER — MORPHINE SULFATE (PF) 4 MG/ML IV SOLN
4.0000 mg | INTRAVENOUS | Status: DC | PRN
  Administered 2017-06-26: 4 mg via INTRAVENOUS
  Filled 2017-06-26: qty 1

## 2017-06-26 MED ORDER — IOPAMIDOL (ISOVUE-300) INJECTION 61%
100.0000 mL | Freq: Once | INTRAVENOUS | Status: AC | PRN
  Administered 2017-06-26: 100 mL via INTRAVENOUS

## 2017-06-26 MED ORDER — ONDANSETRON 4 MG PO TBDP: 4.0000 mg | ORAL_TABLET | Freq: Three times a day (TID) | ORAL | 0 refills | Status: DC | PRN

## 2017-06-26 MED ORDER — HYDROCODONE-ACETAMINOPHEN 5-325 MG PO TABS
1.0000 | ORAL_TABLET | Freq: Once | ORAL | Status: AC
  Administered 2017-06-26: 1 via ORAL
  Filled 2017-06-26: qty 1

## 2017-06-26 MED ORDER — HYDROCODONE-ACETAMINOPHEN 5-325 MG PO TABS: 1.0000 | ORAL_TABLET | ORAL | 0 refills | Status: DC | PRN

## 2017-06-26 MED ORDER — SODIUM CHLORIDE 0.9 % IV SOLN
8.0000 mg | Freq: Once | INTRAVENOUS | Status: AC
  Administered 2017-06-26: 8 mg via INTRAVENOUS
  Filled 2017-06-26: qty 4

## 2017-06-26 MED ORDER — HYDROMORPHONE HCL 1 MG/ML IJ SOLN: 0.5000 mg | INTRAMUSCULAR | Status: DC | PRN

## 2017-06-26 MED ORDER — METRONIDAZOLE 500 MG PO TABS: 500.0000 mg | ORAL_TABLET | Freq: Three times a day (TID) | ORAL | 0 refills | Status: DC

## 2017-06-26 NOTE — ED Notes (Signed)
ED Provider at bedside. 

## 2017-06-26 NOTE — Discharge Instructions (Signed)
Low residue diet until symptoms are improving. Recheck in ER with any worsening symptoms, or inability to tolerate oral medications.

## 2017-06-26 NOTE — ED Provider Notes (Signed)
[facility] DEPT Provider Note   CSN: [id number] Arrival date & time: 06/26/17  1323     History   Chief Complaint Chief Complaint  Patient presents with  . Diverticulitis  . Abdominal Pain    HPI Kelly Glover is a 66 y.o. female. Chief complaint is "I think I have diverticulitis".  HPI this is a 66 year old female. History of insulin-dependent diabetes on insulin pump. History of revious diverticulitis. She has developed left-sided lower abdominal pain for the last 24 hours. Started on Augmentin yesterday that she had at home from her previous episode of diverticulitis. Is not improving. Had some nausea today and presents here.  Has had some fecal urgency. No diarrhea. Is having bowel movements. Nausea but no vomiting. No fevers.  Past Medical History:  Diagnosis Date  . Diabetes ([place])   . History of diverticulitis   . History of kidney stones   . 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 Neuromuscular disorder ([place])    inappropriate mast cell response disorder   . PONV (postoperative nausea and vomiting)   . Squamous cell carcinoma of left lower leg 2015    Patient Active Problem List   Diagnosis Date Noted  . Status post surgery 09/17/2014  . Rectocele 07/19/2014  . Genuine stress incontinence, female 07/19/2014  . Mast cell disorder     Past Surgical History:  Procedure Laterality Date  . BREAST BIOPSY Left ?   benign  . DIAGNOSTIC LAPAROSCOPY    . RECTOCELE REPAIR N/A 09/17/2014   Procedure: POSTERIOR REPAIR , enterocele repair;  Surgeon: Jamey Reas de Berton Lan, MD;  Location: [facility];  Service: Gynecology;  Laterality: N/A;  . TONSILLECTOMY  childhood  . TOTAL ABDOMINAL HYSTERECTOMY W/ BILATERAL SALPINGOOPHORECTOMY  05/20/98    OB History    Gravida Para Term Preterm AB Living   4 2 2   2 2    SAB TAB Ectopic Multiple Live Births   1   1   2        Home Medications    Prior to Admission medications   Medication  Sig Start Date End Date Taking? Authorizing Provider  rosuvastatin (CRESTOR) 40 MG tablet Take 40 mg by mouth daily.  06/27/14  Yes [provider]  amoxicillin-clavulanate (AUGMENTIN) 875-125 MG tablet Take 1 tablet by mouth 2 (two) times daily. 06/26/17   Tanna Furry, MD  aspirin EC 81 MG tablet Take 81 mg by mouth daily.    [provider]  cetirizine (ZYRTEC) 10 MG tablet Take 10 mg by mouth daily.    [provider]  Cholecalciferol (VITAMIN D) 2000 UNITS CAPS Take 1 capsule by mouth daily.    [provider]  estradiol (ESTRACE VAGINAL) 0.1 MG/GM vaginal cream Place 1/2 gm per vagina nightly for 2 weeks and then 1/2 gm per vagina twice weekly. 10/17/14   Nunzio Cobbs, MD  estradiol (ESTRACE) 2 MG tablet  08/23/14   [provider]  ezetimibe (ZETIA) 10 MG tablet Take 10 mg by mouth daily. 04/19/17   [provider]  famotidine (PEPCID AC) 10 MG chewable tablet Chew 10 mg by mouth daily.     [provider]  FOLIC ACID PO Take 277 mcg by mouth daily.     [provider]  HYDROcodone-acetaminophen (NORCO/VICODIN) 5-325 MG tablet Take 1 tablet by mouth every 4 (four) hours as needed. 06/26/17   Tanna Furry, MD  JARDIANCE 10 MG  TABS Take 1 tablet by mouth daily. 06/24/14   [provider]  JARDIANCE 25 MG TABS tablet Take 25 mg by mouth daily. 05/12/17   [provider]  metroNIDAZOLE (FLAGYL) 500 MG tablet Take 1 tablet (500 mg total) by mouth 3 (three) times daily. 06/26/17   Tanna Furry, MD  NOVOLOG 100 UNIT/ML injection Inject into the skin as directed. Insulin pump 06/25/14   [provider]  omeprazole-sodium bicarbonate (ZEGERID) 40-1100 MG per capsule Take 1 capsule by mouth daily before breakfast.    [provider]  ondansetron (ZOFRAN ODT) 4 MG disintegrating tablet Take 1 tablet (4 mg total) by mouth every 8 (eight) hours as needed for nausea. 06/26/17   Tanna Furry, MD    ondansetron (ZOFRAN) 4 MG tablet Take 1 tablet (4 mg total) by mouth every 6 (six) hours. 08/29/16   Montine Circle, PA-C  ONE TOUCH ULTRA TEST test strip  06/24/14   [provider]  ramipril (ALTACE) 2.5 MG capsule Take 1 capsule by mouth daily. 06/27/14   [provider]  sertraline (ZOLOFT) 100 MG tablet Take 50 mg by mouth daily.  06/24/14   [provider]    Family History Family History  Problem Relation Age of Onset  . Diabetes Mother   . Heart disease Mother   . Heart disease Father   . Thyroid disease Sister   . Asthma Maternal Grandmother 49  . Diabetes Maternal Grandmother   . Breast cancer Maternal Grandmother   . Diabetes Maternal Grandfather   . Graves' disease Son   . Thyroid disease Son     Social History Social History  Substance Use Topics  . Smoking status: Never Smoker  . Smokeless tobacco: Never Used  . Alcohol use No     Allergies   Diflucan [fluconazole]; Scallops [shellfish allergy]; Percocet [oxycodone-acetaminophen]; Doxycycline; and Griseofulvin   Review of Systems Review of Systems  Constitutional: Negative for appetite change, chills, diaphoresis, fatigue and fever.  HENT: Negative for mouth sores, sore throat and trouble swallowing.   Eyes: Negative for visual disturbance.  Respiratory: Negative for cough, chest tightness, shortness of breath and wheezing.   Cardiovascular: Negative for chest pain.  Gastrointestinal: Positive for abdominal pain and nausea. Negative for abdominal distention, diarrhea and vomiting.  Endocrine: Negative for polydipsia, polyphagia and polyuria.  Genitourinary: Negative for dysuria, frequency and hematuria.  Musculoskeletal: Negative for gait problem.  Skin: Negative for color change, pallor and rash.  Neurological: Negative for dizziness, syncope, light-headedness and headaches.  Hematological: Does not bruise/bleed easily.  Psychiatric/Behavioral: Negative for behavioral problems  and confusion.     Physical Exam Updated Vital Signs BP 136/62   Pulse 84   Temp 98.4 F (36.9 C) (Oral)   Resp (!) 24   Ht 5' 6"  (1.676 m)   Wt 74.8 kg (165 lb)   SpO2 93%   BMI 26.63 kg/m   Physical Exam  Constitutional: She is oriented to person, place, and time. She appears well-developed and well-nourished. No distress.  HENT:  Head: Normocephalic.  Eyes: Pupils are equal, round, and reactive to light. Conjunctivae are normal. No scleral icterus.  Neck: Normal range of motion. Neck supple. No thyromegaly present.  Cardiovascular: Normal rate and regular rhythm.  Exam reveals no gallop and no friction rub.   No murmur heard. Pulmonary/Chest: Effort normal and breath sounds normal. No respiratory distress. She has no wheezes. She has no rales.  Abdominal: Soft. Bowel sounds are normal. She exhibits no  distension. There is no tenderness. There is no rebound.  Soft abdomen. Tenderness in the left lower abdomen without peritoneal irritation. Normal active bowel sounds.  Musculoskeletal: Normal range of motion.  Neurological: She is alert and oriented to person, place, and time.  Skin: Skin is warm and dry. No rash noted.  Psychiatric: She has a normal mood and affect. Her behavior is normal.     ED Treatments / Results  Labs (all labs ordered are listed, but only abnormal results are displayed) Labs Reviewed  COMPREHENSIVE METABOLIC PANEL - Abnormal; Notable for the following:       Result Value   Glucose, Bld 171 (*)    All other components within normal limits  CBC - Abnormal; Notable for the following:    WBC 14.4 (*)    All other components within normal limits  URINALYSIS, ROUTINE W REFLEX MICROSCOPIC - Abnormal; Notable for the following:    Color, Urine STRAW (*)    Glucose, UA >=500 (*)    Ketones, ur 5 (*)    Squamous Epithelial / LPF 0-5 (*)    All other components within normal limits  LIPASE, BLOOD    EKG  EKG Interpretation None        Radiology Ct Abdomen Pelvis W Contrast  Result Date: 06/26/2017 CLINICAL DATA:  Left lower quadrant pain. EXAM: CT ABDOMEN AND PELVIS WITH CONTRAST TECHNIQUE: Multidetector CT imaging of the abdomen and pelvis was performed using the standard protocol following bolus administration of intravenous contrast. CONTRAST:  115m ISOVUE-300 IOPAMIDOL (ISOVUE-300) INJECTION 61% COMPARISON:  April 13, 2014 FINDINGS: Lower chest: No acute abnormality. Hepatobiliary: No focal liver abnormality is seen. No gallstones, gallbladder wall thickening, or biliary dilatation. Pancreas: Unremarkable. No pancreatic ductal dilatation or surrounding inflammatory changes. Spleen: Normal in size without focal abnormality. Adrenals/Urinary Tract: The adrenal glands are normal. There is an extrarenal pelvis associated with the left kidney, unchanged. The left kidney is otherwise normal. No ureteral stones. The bladder is normal. Stomach/Bowel: The stomach and small bowel are normal. Colonic diverticuli are identified. There is stranding and wall thickening associated with the proximal sigmoid colon consistent with diverticulitis. No extraluminal gas identified. No abscess noted. The remainder of the colon is unremarkable. The appendix is normal. Vascular/Lymphatic: Atherosclerotic changes are seen in the nonaneurysmal aorta and iliac vessels. No adenopathy. Reproductive: Status post hysterectomy. No adnexal masses. Other: No abdominal wall hernia or abnormality. No abdominopelvic ascites. Musculoskeletal: No acute or significant osseous findings. IMPRESSION: 1. Sigmoid diverticulitis. 2. Calcified atherosclerosis in the nonaneurysmal aorta and iliac vessels. Aortic Atherosclerosis (ICD10-I70.0). Electronically Signed   By: DDorise BullionIII M.D   On: 06/26/2017 18:07    Procedures Procedures (including critical care time)  Medications Ordered in ED Medications  iopamidol (ISOVUE-300) 61 % injection (not administered)   morphine 4 MG/ML injection 4 mg (4 mg Intravenous Given 06/26/17 1757)  HYDROmorphone (DILAUDID) injection 0.5 mg (not administered)  ondansetron (ZOFRAN) 8 mg in sodium chloride 0.9 % 50 mL IVPB (8 mg Intravenous New Bag/Given 06/26/17 1946)  ondansetron (ZOFRAN-ODT) disintegrating tablet 4 mg (4 mg Oral Given 06/26/17 1529)  ondansetron (ZOFRAN) injection 4 mg (4 mg Intravenous Given 06/26/17 1756)  metroNIDAZOLE (FLAGYL) IVPB 500 mg (0 mg Intravenous Stopped 06/26/17 1947)  Ampicillin-Sulbactam (UNASYN) 3 g in sodium chloride 0.9 % 100 mL IVPB (0 g Intravenous Stopped 06/26/17 1947)  iopamidol (ISOVUE-300) 61 % injection 100 mL (100 mLs Intravenous Contrast Given 06/26/17 1742)  HYDROcodone-acetaminophen (NORCO/VICODIN) 5-325 MG per tablet 1  tablet (1 tablet Oral Given 06/26/17 1946)     Initial Impression / Assessment and Plan / ED Course  I have reviewed the triage vital signs and the nursing notes.  Pertinent labs & imaging results that were available during my care of the patient were reviewed by me and considered in my medical decision making (see chart for details).    CT scan shows him complicated sigmoid diverticulitis. Has leukocytosis. Glucose 171. Kidney function intact. Given IV Unasyn, and pain medication including morphine, followed by Dilaudid. Zofran for nausea. And IV Flagyl.  I discussed with her. I have offered her admission. She has a strong desire treated as an outpatient. Her husband is a Engineer, drilling. She feels comfortable managing her diabetes. Of encourage her to recheck here with any worsening symptoms including worsening pain needed gradual or sudden, vomiting, fever, or inability to tolerate by mouth medications. Also with difficult to control hyperglycemia. 6 depression understanding.   Final Clinical Impressions(s) / ED Diagnoses   Final diagnoses:  Diverticulitis    New Prescriptions New Prescriptions   AMOXICILLIN-CLAVULANATE (AUGMENTIN) 875-125 MG TABLET     Take 1 tablet by mouth 2 (two) times daily.   HYDROCODONE-ACETAMINOPHEN (NORCO/VICODIN) 5-325 MG TABLET    Take 1 tablet by mouth every 4 (four) hours as needed.   METRONIDAZOLE (FLAGYL) 500 MG TABLET    Take 1 tablet (500 mg total) by mouth 3 (three) times daily.   ONDANSETRON (ZOFRAN ODT) 4 MG DISINTEGRATING TABLET    Take 1 tablet (4 mg total) by mouth every 8 (eight) hours as needed for nausea.     Tanna Furry, MD 06/26/17 [phone]

## 2017-06-26 NOTE — ED Notes (Signed)
Pt checked her own CBG. 122 is pt current CBG

## 2017-06-26 NOTE — ED Triage Notes (Signed)
Pt comes in with LLQ pain that began a few days ago. Pt's husband is a doctor and states she has all of the signs of her diverticulitis flaring up. Started Augmentin two days ago without much relief. Endorses nausea and and inability to eat. Denies diarrhea.

## 2017-06-29 DIAGNOSIS — R10814 Left lower quadrant abdominal tenderness: Secondary | ICD-10-CM | POA: Diagnosis not present

## 2017-06-29 DIAGNOSIS — Z800 Family history of malignant neoplasm of digestive organs: Secondary | ICD-10-CM | POA: Diagnosis not present

## 2017-06-29 DIAGNOSIS — K5900 Constipation, unspecified: Secondary | ICD-10-CM | POA: Diagnosis not present

## 2017-06-29 DIAGNOSIS — K5732 Diverticulitis of large intestine without perforation or abscess without bleeding: Secondary | ICD-10-CM | POA: Diagnosis not present

## 2017-07-13 DIAGNOSIS — Z794 Long term (current) use of insulin: Secondary | ICD-10-CM | POA: Diagnosis not present

## 2017-07-13 DIAGNOSIS — Z4681 Encounter for fitting and adjustment of insulin pump: Secondary | ICD-10-CM | POA: Diagnosis not present

## 2017-07-13 DIAGNOSIS — K5792 Diverticulitis of intestine, part unspecified, without perforation or abscess without bleeding: Secondary | ICD-10-CM | POA: Diagnosis not present

## 2017-07-13 DIAGNOSIS — E10319 Type 1 diabetes mellitus with unspecified diabetic retinopathy without macular edema: Secondary | ICD-10-CM | POA: Diagnosis not present

## 2017-07-21 DIAGNOSIS — E119 Type 2 diabetes mellitus without complications: Secondary | ICD-10-CM | POA: Diagnosis not present

## 2017-08-25 DIAGNOSIS — K219 Gastro-esophageal reflux disease without esophagitis: Secondary | ICD-10-CM | POA: Diagnosis not present

## 2017-08-25 DIAGNOSIS — N200 Calculus of kidney: Secondary | ICD-10-CM | POA: Diagnosis not present

## 2017-08-25 DIAGNOSIS — E10319 Type 1 diabetes mellitus with unspecified diabetic retinopathy without macular edema: Secondary | ICD-10-CM | POA: Diagnosis not present

## 2017-08-25 DIAGNOSIS — Z6826 Body mass index (BMI) 26.0-26.9, adult: Secondary | ICD-10-CM | POA: Diagnosis not present

## 2017-08-25 DIAGNOSIS — I700 Atherosclerosis of aorta: Secondary | ICD-10-CM | POA: Diagnosis not present

## 2017-08-25 DIAGNOSIS — E7849 Other hyperlipidemia: Secondary | ICD-10-CM | POA: Diagnosis not present

## 2017-08-25 DIAGNOSIS — M859 Disorder of bone density and structure, unspecified: Secondary | ICD-10-CM | POA: Diagnosis not present

## 2017-08-25 DIAGNOSIS — R002 Palpitations: Secondary | ICD-10-CM | POA: Diagnosis not present

## 2017-08-25 DIAGNOSIS — E11319 Type 2 diabetes mellitus with unspecified diabetic retinopathy without macular edema: Secondary | ICD-10-CM | POA: Diagnosis not present

## 2017-08-25 DIAGNOSIS — K5792 Diverticulitis of intestine, part unspecified, without perforation or abscess without bleeding: Secondary | ICD-10-CM | POA: Diagnosis not present

## 2017-08-31 ENCOUNTER — Other Ambulatory Visit: Payer: Self-pay | Admitting: Endocrinology

## 2017-08-31 DIAGNOSIS — Z139 Encounter for screening, unspecified: Secondary | ICD-10-CM

## 2017-09-07 DIAGNOSIS — K5730 Diverticulosis of large intestine without perforation or abscess without bleeding: Secondary | ICD-10-CM | POA: Diagnosis not present

## 2017-09-07 DIAGNOSIS — Z8371 Family history of colonic polyps: Secondary | ICD-10-CM | POA: Diagnosis not present

## 2017-09-07 DIAGNOSIS — Z1211 Encounter for screening for malignant neoplasm of colon: Secondary | ICD-10-CM | POA: Diagnosis not present

## 2017-09-07 DIAGNOSIS — Z86010 Personal history of colonic polyps: Secondary | ICD-10-CM | POA: Diagnosis not present

## 2017-09-07 DIAGNOSIS — Z800 Family history of malignant neoplasm of digestive organs: Secondary | ICD-10-CM | POA: Diagnosis not present

## 2017-10-13 DIAGNOSIS — Z1231 Encounter for screening mammogram for malignant neoplasm of breast: Secondary | ICD-10-CM | POA: Diagnosis not present

## 2017-10-14 DIAGNOSIS — E10319 Type 1 diabetes mellitus with unspecified diabetic retinopathy without macular edema: Secondary | ICD-10-CM | POA: Diagnosis not present

## 2017-10-14 DIAGNOSIS — Z4681 Encounter for fitting and adjustment of insulin pump: Secondary | ICD-10-CM | POA: Diagnosis not present

## 2017-10-14 DIAGNOSIS — Z794 Long term (current) use of insulin: Secondary | ICD-10-CM | POA: Diagnosis not present

## 2017-11-09 IMAGING — CT CT ABD-PELV W/ CM
2 of 5 series · 16 of 46 positions shown, 18 images · IV contrast (iopamidol)
Comparison: April 13, 2014

CLINICAL DATA: Left lower quadrant pain.

EXAM:
CT ABDOMEN AND PELVIS WITH CONTRAST
TECHNIQUE: Multidetector CT imaging of the abdomen and pelvis was performed
using the standard protocol following bolus administration of
intravenous contrast.
CONTRAST:  100mL NWCWWF-KWW IOPAMIDOL (NWCWWF-KWW) INJECTION 61%

[Series 2: abd/pel with · axial · 0.87mm/px · z∈[-417,-32]mm · 13 of 89 slices shown, 15 images]
[im 6/89  soft-tissue]
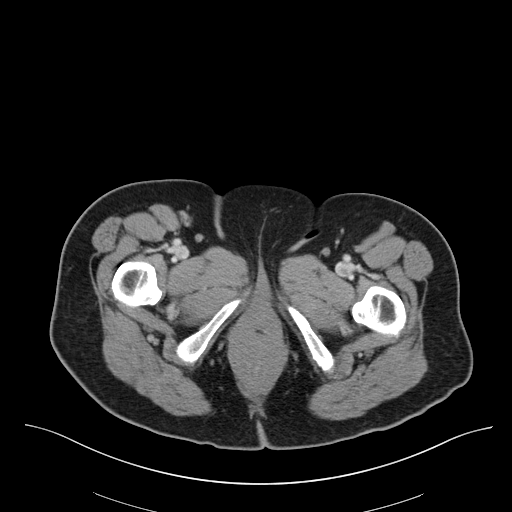
[im 6/89  bone]
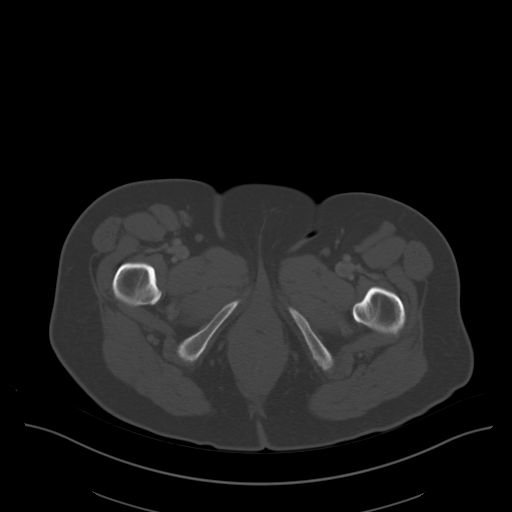
[im 12/89  soft-tissue]
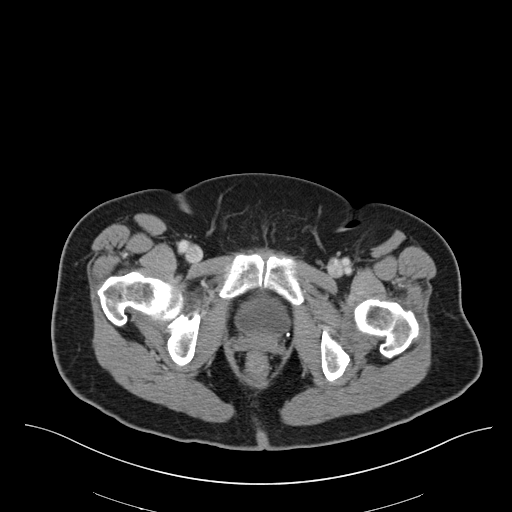
[im 18/89  soft-tissue]
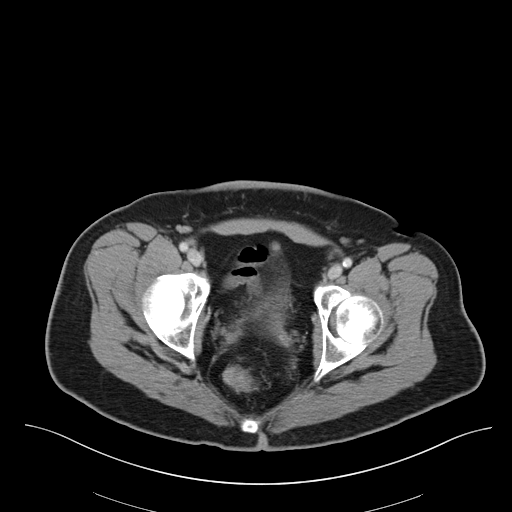
[im 24/89  soft-tissue]
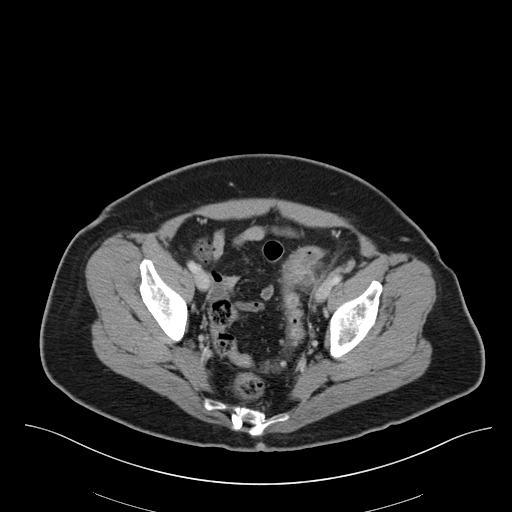
[im 30/89  soft-tissue]
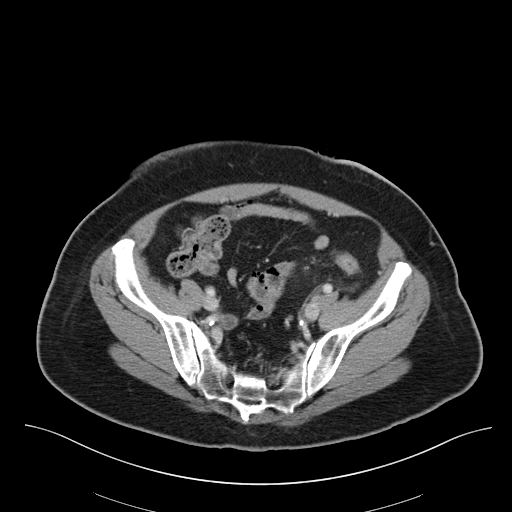
[im 36/89  soft-tissue]
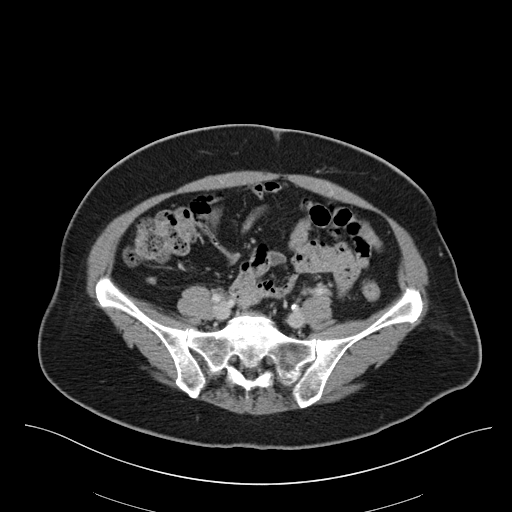
[im 47/89  soft-tissue]
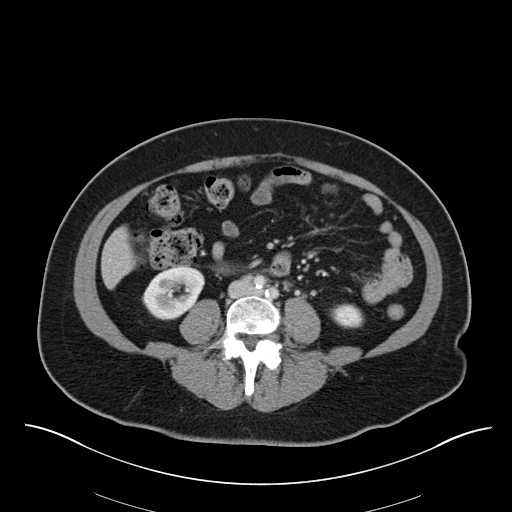
[im 53/89  soft-tissue]
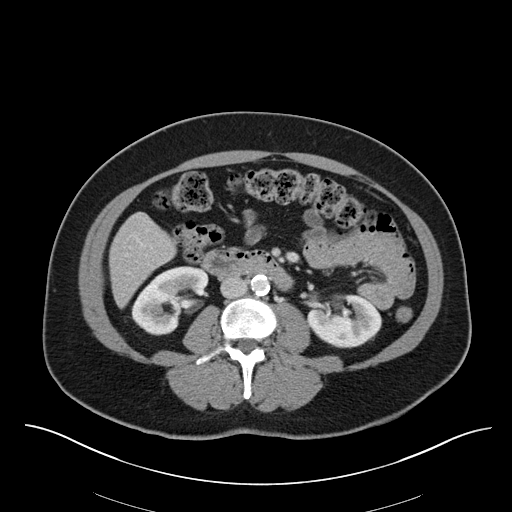
[im 59/89  soft-tissue]
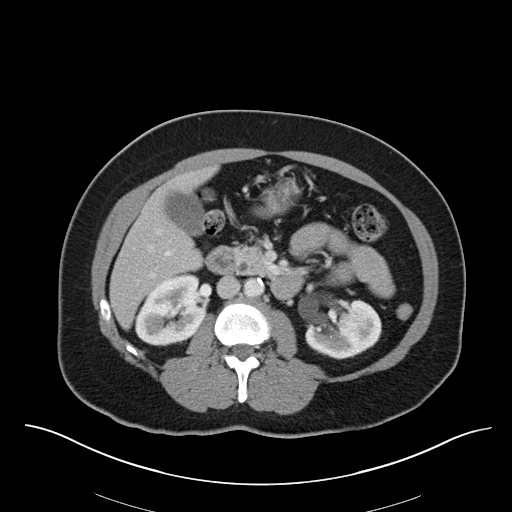
[im 59/89  bone]
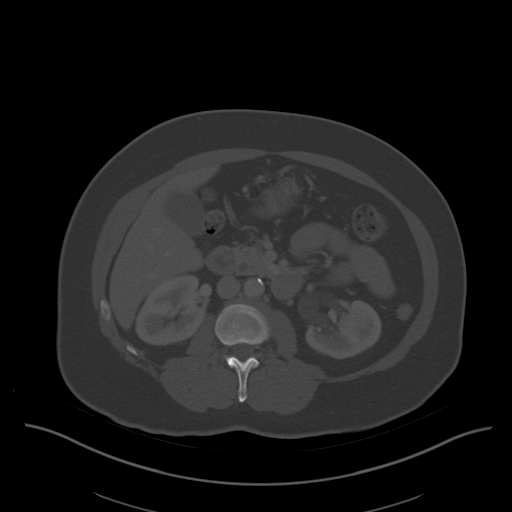
[im 65/89  soft-tissue]
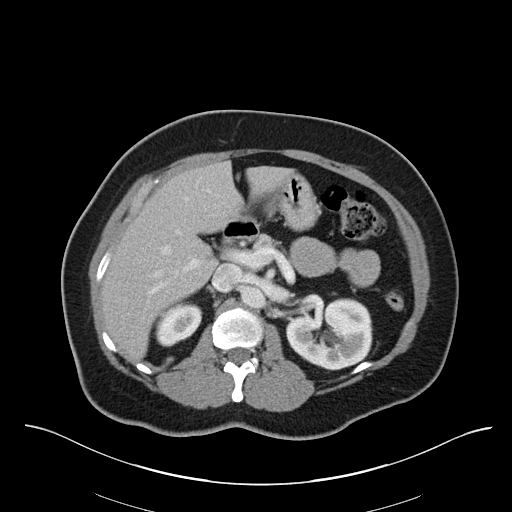
[im 71/89  soft-tissue]
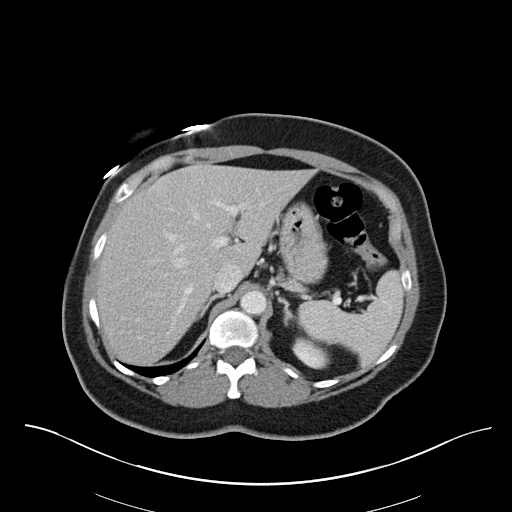
[im 77/89  soft-tissue]
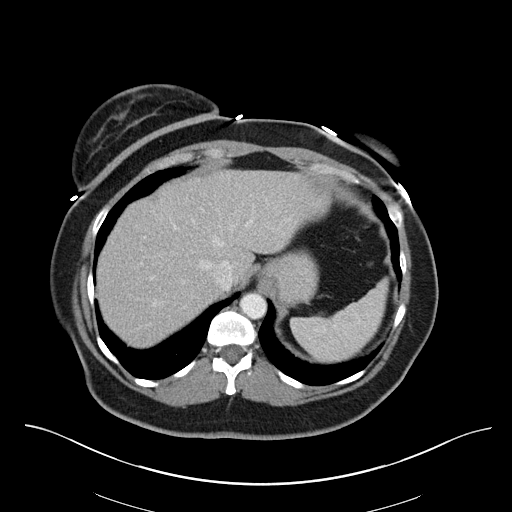
[im 83/89  soft-tissue]
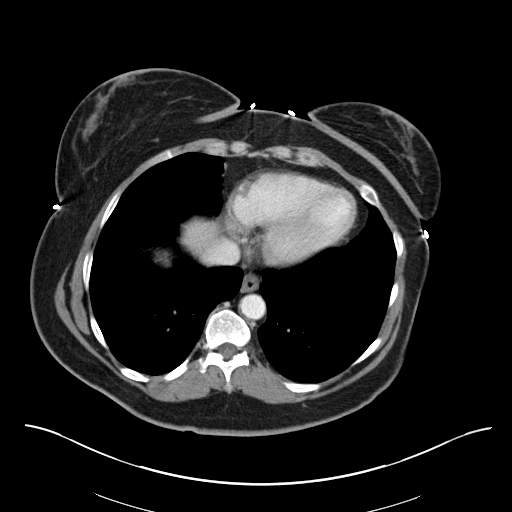

[Series 4: coronal a/|p · coronal · 0.74mm/px · 3 of 135 slices shown]
[im 45/135  soft-tissue]
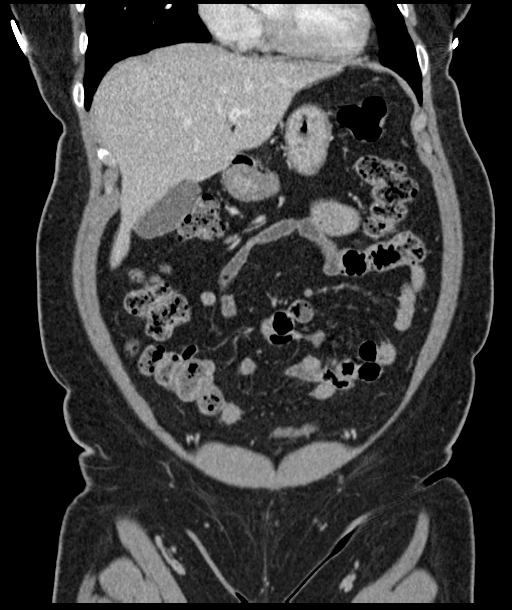
[im 60/135  soft-tissue]
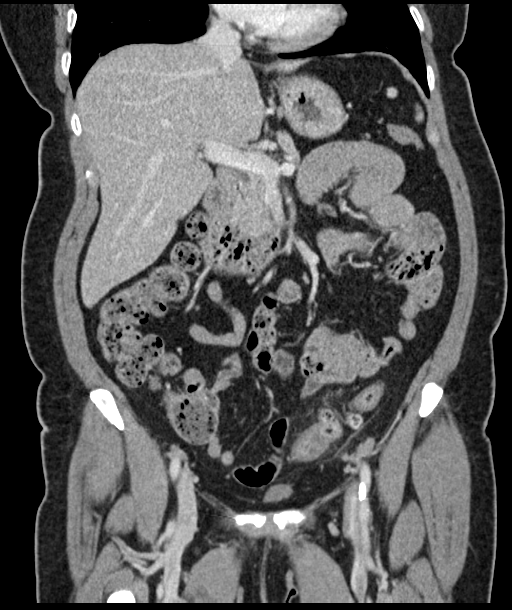
[im 75/135  soft-tissue]
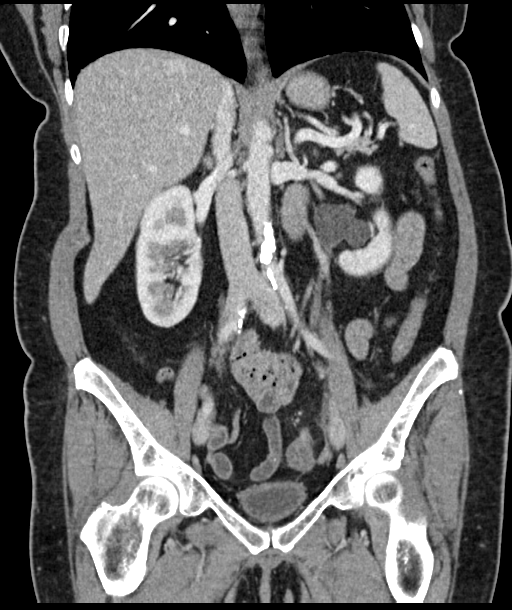

[16 of 46 positions shown; findings below may reference images not displayed]

FINDINGS: Lower chest: No acute abnormality.

Hepatobiliary: No focal liver abnormality is seen. No gallstones,
gallbladder wall thickening, or biliary dilatation.

Pancreas: Unremarkable. No pancreatic ductal dilatation or
surrounding inflammatory changes.

Spleen: Normal in size without focal abnormality.

Adrenals/Urinary Tract: The adrenal glands are normal. There is an
extrarenal pelvis associated with the left kidney, unchanged. The
left kidney is otherwise normal. No ureteral stones. The bladder is
normal.

Stomach/Bowel: The stomach and small bowel are normal. Colonic
diverticuli are identified. There is stranding and wall thickening
associated with the proximal sigmoid colon consistent with
diverticulitis. No extraluminal gas identified. No abscess noted.
The remainder of the colon is unremarkable. The appendix is normal.

Vascular/Lymphatic: Atherosclerotic changes are seen in the
nonaneurysmal aorta and iliac vessels. No adenopathy.

Reproductive: Status post hysterectomy. No adnexal masses.

Other: No abdominal wall hernia or abnormality. No abdominopelvic
ascites.

Musculoskeletal: No acute or significant osseous findings.
IMPRESSION: 1. Sigmoid diverticulitis.
2. Calcified atherosclerosis in the nonaneurysmal aorta and iliac
vessels.

Aortic Atherosclerosis ([id number]).

## 2018-01-11 DIAGNOSIS — E7849 Other hyperlipidemia: Secondary | ICD-10-CM | POA: Diagnosis not present

## 2018-01-11 DIAGNOSIS — Z794 Long term (current) use of insulin: Secondary | ICD-10-CM | POA: Diagnosis not present

## 2018-01-11 DIAGNOSIS — E109 Type 1 diabetes mellitus without complications: Secondary | ICD-10-CM | POA: Diagnosis not present

## 2018-01-11 DIAGNOSIS — Z4681 Encounter for fitting and adjustment of insulin pump: Secondary | ICD-10-CM | POA: Diagnosis not present

## 2018-02-11 DIAGNOSIS — K5792 Diverticulitis of intestine, part unspecified, without perforation or abscess without bleeding: Secondary | ICD-10-CM | POA: Diagnosis not present

## 2018-02-11 DIAGNOSIS — Z1389 Encounter for screening for other disorder: Secondary | ICD-10-CM | POA: Diagnosis not present

## 2018-02-11 DIAGNOSIS — I700 Atherosclerosis of aorta: Secondary | ICD-10-CM | POA: Diagnosis not present

## 2018-02-11 DIAGNOSIS — E11319 Type 2 diabetes mellitus with unspecified diabetic retinopathy without macular edema: Secondary | ICD-10-CM | POA: Diagnosis not present

## 2018-02-11 DIAGNOSIS — E7849 Other hyperlipidemia: Secondary | ICD-10-CM | POA: Diagnosis not present

## 2018-02-11 DIAGNOSIS — M859 Disorder of bone density and structure, unspecified: Secondary | ICD-10-CM | POA: Diagnosis not present

## 2018-02-11 DIAGNOSIS — N200 Calculus of kidney: Secondary | ICD-10-CM | POA: Diagnosis not present

## 2018-02-11 DIAGNOSIS — E10319 Type 1 diabetes mellitus with unspecified diabetic retinopathy without macular edema: Secondary | ICD-10-CM | POA: Diagnosis not present

## 2018-02-11 DIAGNOSIS — E668 Other obesity: Secondary | ICD-10-CM | POA: Diagnosis not present

## 2018-04-13 DIAGNOSIS — Z4681 Encounter for fitting and adjustment of insulin pump: Secondary | ICD-10-CM | POA: Diagnosis not present

## 2018-04-13 DIAGNOSIS — E10319 Type 1 diabetes mellitus with unspecified diabetic retinopathy without macular edema: Secondary | ICD-10-CM | POA: Diagnosis not present

## 2018-04-13 DIAGNOSIS — Z794 Long term (current) use of insulin: Secondary | ICD-10-CM | POA: Diagnosis not present

## 2018-04-13 DIAGNOSIS — E1051 Type 1 diabetes mellitus with diabetic peripheral angiopathy without gangrene: Secondary | ICD-10-CM | POA: Diagnosis not present

## 2018-05-20 DIAGNOSIS — L918 Other hypertrophic disorders of the skin: Secondary | ICD-10-CM | POA: Diagnosis not present

## 2018-05-20 DIAGNOSIS — Z808 Family history of malignant neoplasm of other organs or systems: Secondary | ICD-10-CM | POA: Diagnosis not present

## 2018-05-20 DIAGNOSIS — D485 Neoplasm of uncertain behavior of skin: Secondary | ICD-10-CM | POA: Diagnosis not present

## 2018-05-20 DIAGNOSIS — D225 Melanocytic nevi of trunk: Secondary | ICD-10-CM | POA: Diagnosis not present

## 2018-05-20 DIAGNOSIS — L570 Actinic keratosis: Secondary | ICD-10-CM | POA: Diagnosis not present

## 2018-05-20 DIAGNOSIS — L821 Other seborrheic keratosis: Secondary | ICD-10-CM | POA: Diagnosis not present

## 2018-05-20 DIAGNOSIS — Z85828 Personal history of other malignant neoplasm of skin: Secondary | ICD-10-CM | POA: Diagnosis not present

## 2018-07-08 DIAGNOSIS — Z23 Encounter for immunization: Secondary | ICD-10-CM | POA: Diagnosis not present

## 2018-07-08 DIAGNOSIS — E119 Type 2 diabetes mellitus without complications: Secondary | ICD-10-CM | POA: Diagnosis not present

## 2018-07-08 DIAGNOSIS — H2513 Age-related nuclear cataract, bilateral: Secondary | ICD-10-CM | POA: Diagnosis not present

## 2018-07-12 DIAGNOSIS — E1165 Type 2 diabetes mellitus with hyperglycemia: Secondary | ICD-10-CM | POA: Diagnosis not present

## 2018-07-12 DIAGNOSIS — E11319 Type 2 diabetes mellitus with unspecified diabetic retinopathy without macular edema: Secondary | ICD-10-CM | POA: Diagnosis not present

## 2018-07-12 DIAGNOSIS — Z794 Long term (current) use of insulin: Secondary | ICD-10-CM | POA: Diagnosis not present

## 2018-07-12 DIAGNOSIS — N200 Calculus of kidney: Secondary | ICD-10-CM | POA: Diagnosis not present

## 2018-07-12 DIAGNOSIS — R82998 Other abnormal findings in urine: Secondary | ICD-10-CM | POA: Diagnosis not present

## 2018-07-12 DIAGNOSIS — K5792 Diverticulitis of intestine, part unspecified, without perforation or abscess without bleeding: Secondary | ICD-10-CM | POA: Diagnosis not present

## 2018-07-12 DIAGNOSIS — M859 Disorder of bone density and structure, unspecified: Secondary | ICD-10-CM | POA: Diagnosis not present

## 2018-07-12 DIAGNOSIS — K219 Gastro-esophageal reflux disease without esophagitis: Secondary | ICD-10-CM | POA: Diagnosis not present

## 2018-07-12 DIAGNOSIS — E668 Other obesity: Secondary | ICD-10-CM | POA: Diagnosis not present

## 2018-07-12 DIAGNOSIS — E7849 Other hyperlipidemia: Secondary | ICD-10-CM | POA: Diagnosis not present

## 2018-08-01 DIAGNOSIS — H2513 Age-related nuclear cataract, bilateral: Secondary | ICD-10-CM | POA: Diagnosis not present

## 2018-08-01 DIAGNOSIS — H25013 Cortical age-related cataract, bilateral: Secondary | ICD-10-CM | POA: Diagnosis not present

## 2018-08-01 DIAGNOSIS — H524 Presbyopia: Secondary | ICD-10-CM | POA: Diagnosis not present

## 2018-09-06 DIAGNOSIS — H25011 Cortical age-related cataract, right eye: Secondary | ICD-10-CM | POA: Diagnosis not present

## 2018-09-06 DIAGNOSIS — H25811 Combined forms of age-related cataract, right eye: Secondary | ICD-10-CM | POA: Diagnosis not present

## 2018-09-06 DIAGNOSIS — H2511 Age-related nuclear cataract, right eye: Secondary | ICD-10-CM | POA: Diagnosis not present

## 2018-10-11 DIAGNOSIS — Z4681 Encounter for fitting and adjustment of insulin pump: Secondary | ICD-10-CM | POA: Diagnosis not present

## 2018-10-11 DIAGNOSIS — Z794 Long term (current) use of insulin: Secondary | ICD-10-CM | POA: Diagnosis not present

## 2018-10-11 DIAGNOSIS — E10319 Type 1 diabetes mellitus with unspecified diabetic retinopathy without macular edema: Secondary | ICD-10-CM | POA: Diagnosis not present

## 2018-11-01 DIAGNOSIS — H2512 Age-related nuclear cataract, left eye: Secondary | ICD-10-CM | POA: Diagnosis not present

## 2018-11-01 DIAGNOSIS — H25042 Posterior subcapsular polar age-related cataract, left eye: Secondary | ICD-10-CM | POA: Diagnosis not present

## 2018-11-01 DIAGNOSIS — H25812 Combined forms of age-related cataract, left eye: Secondary | ICD-10-CM | POA: Diagnosis not present

## 2018-11-08 DIAGNOSIS — R1013 Epigastric pain: Secondary | ICD-10-CM | POA: Diagnosis not present

## 2018-12-05 DIAGNOSIS — Z794 Long term (current) use of insulin: Secondary | ICD-10-CM | POA: Diagnosis not present

## 2018-12-05 DIAGNOSIS — N200 Calculus of kidney: Secondary | ICD-10-CM | POA: Diagnosis not present

## 2018-12-05 DIAGNOSIS — E10319 Type 1 diabetes mellitus with unspecified diabetic retinopathy without macular edema: Secondary | ICD-10-CM | POA: Diagnosis not present

## 2018-12-05 DIAGNOSIS — M859 Disorder of bone density and structure, unspecified: Secondary | ICD-10-CM | POA: Diagnosis not present

## 2018-12-05 DIAGNOSIS — E7849 Other hyperlipidemia: Secondary | ICD-10-CM | POA: Diagnosis not present

## 2018-12-05 DIAGNOSIS — K219 Gastro-esophageal reflux disease without esophagitis: Secondary | ICD-10-CM | POA: Diagnosis not present

## 2018-12-05 DIAGNOSIS — E1065 Type 1 diabetes mellitus with hyperglycemia: Secondary | ICD-10-CM | POA: Diagnosis not present

## 2018-12-13 DIAGNOSIS — H1013 Acute atopic conjunctivitis, bilateral: Secondary | ICD-10-CM | POA: Diagnosis not present

## 2018-12-27 DIAGNOSIS — H1013 Acute atopic conjunctivitis, bilateral: Secondary | ICD-10-CM | POA: Diagnosis not present

## 2019-01-10 DIAGNOSIS — E113212 Type 2 diabetes mellitus with mild nonproliferative diabetic retinopathy with macular edema, left eye: Secondary | ICD-10-CM | POA: Diagnosis not present

## 2019-01-10 DIAGNOSIS — E119 Type 2 diabetes mellitus without complications: Secondary | ICD-10-CM | POA: Diagnosis not present

## 2019-03-29 DIAGNOSIS — Z794 Long term (current) use of insulin: Secondary | ICD-10-CM | POA: Diagnosis not present

## 2019-03-29 DIAGNOSIS — E109 Type 1 diabetes mellitus without complications: Secondary | ICD-10-CM | POA: Diagnosis not present

## 2019-03-29 DIAGNOSIS — Z4681 Encounter for fitting and adjustment of insulin pump: Secondary | ICD-10-CM | POA: Diagnosis not present

## 2019-03-29 DIAGNOSIS — E10319 Type 1 diabetes mellitus with unspecified diabetic retinopathy without macular edema: Secondary | ICD-10-CM | POA: Diagnosis not present

## 2019-04-25 DIAGNOSIS — E1165 Type 2 diabetes mellitus with hyperglycemia: Secondary | ICD-10-CM | POA: Diagnosis not present

## 2019-04-25 DIAGNOSIS — E11319 Type 2 diabetes mellitus with unspecified diabetic retinopathy without macular edema: Secondary | ICD-10-CM | POA: Diagnosis not present

## 2019-04-25 DIAGNOSIS — N200 Calculus of kidney: Secondary | ICD-10-CM | POA: Diagnosis not present

## 2019-04-25 DIAGNOSIS — R202 Paresthesia of skin: Secondary | ICD-10-CM | POA: Diagnosis not present

## 2019-04-25 DIAGNOSIS — E785 Hyperlipidemia, unspecified: Secondary | ICD-10-CM | POA: Diagnosis not present

## 2019-04-25 DIAGNOSIS — K5730 Diverticulosis of large intestine without perforation or abscess without bleeding: Secondary | ICD-10-CM | POA: Diagnosis not present

## 2019-04-25 DIAGNOSIS — E10319 Type 1 diabetes mellitus with unspecified diabetic retinopathy without macular edema: Secondary | ICD-10-CM | POA: Diagnosis not present

## 2019-04-25 DIAGNOSIS — Z794 Long term (current) use of insulin: Secondary | ICD-10-CM | POA: Diagnosis not present

## 2019-04-25 DIAGNOSIS — E7849 Other hyperlipidemia: Secondary | ICD-10-CM | POA: Diagnosis not present

## 2019-04-25 DIAGNOSIS — R002 Palpitations: Secondary | ICD-10-CM | POA: Diagnosis not present

## 2019-04-25 DIAGNOSIS — M8580 Other specified disorders of bone density and structure, unspecified site: Secondary | ICD-10-CM | POA: Diagnosis not present

## 2019-04-25 DIAGNOSIS — K219 Gastro-esophageal reflux disease without esophagitis: Secondary | ICD-10-CM | POA: Diagnosis not present

## 2019-04-25 DIAGNOSIS — I700 Atherosclerosis of aorta: Secondary | ICD-10-CM | POA: Diagnosis not present

## 2019-05-25 DIAGNOSIS — Z23 Encounter for immunization: Secondary | ICD-10-CM | POA: Diagnosis not present

## 2019-05-31 DIAGNOSIS — L821 Other seborrheic keratosis: Secondary | ICD-10-CM | POA: Diagnosis not present

## 2019-05-31 DIAGNOSIS — L820 Inflamed seborrheic keratosis: Secondary | ICD-10-CM | POA: Diagnosis not present

## 2019-05-31 DIAGNOSIS — Z85828 Personal history of other malignant neoplasm of skin: Secondary | ICD-10-CM | POA: Diagnosis not present

## 2019-05-31 DIAGNOSIS — L219 Seborrheic dermatitis, unspecified: Secondary | ICD-10-CM | POA: Diagnosis not present

## 2019-05-31 DIAGNOSIS — D2271 Melanocytic nevi of right lower limb, including hip: Secondary | ICD-10-CM | POA: Diagnosis not present

## 2019-05-31 DIAGNOSIS — L570 Actinic keratosis: Secondary | ICD-10-CM | POA: Diagnosis not present

## 2019-05-31 DIAGNOSIS — Z808 Family history of malignant neoplasm of other organs or systems: Secondary | ICD-10-CM | POA: Diagnosis not present

## 2019-05-31 DIAGNOSIS — D485 Neoplasm of uncertain behavior of skin: Secondary | ICD-10-CM | POA: Diagnosis not present

## 2019-05-31 DIAGNOSIS — R238 Other skin changes: Secondary | ICD-10-CM | POA: Diagnosis not present

## 2019-07-04 DIAGNOSIS — E119 Type 2 diabetes mellitus without complications: Secondary | ICD-10-CM | POA: Diagnosis not present

## 2019-07-05 DIAGNOSIS — E10319 Type 1 diabetes mellitus with unspecified diabetic retinopathy without macular edema: Secondary | ICD-10-CM | POA: Diagnosis not present

## 2019-07-05 DIAGNOSIS — Z794 Long term (current) use of insulin: Secondary | ICD-10-CM | POA: Diagnosis not present

## 2019-07-05 DIAGNOSIS — Z4681 Encounter for fitting and adjustment of insulin pump: Secondary | ICD-10-CM | POA: Diagnosis not present

## 2019-07-18 DIAGNOSIS — R110 Nausea: Secondary | ICD-10-CM | POA: Diagnosis not present

## 2019-07-19 ENCOUNTER — Other Ambulatory Visit: Payer: Self-pay

## 2019-07-19 DIAGNOSIS — Z20822 Contact with and (suspected) exposure to covid-19: Secondary | ICD-10-CM

## 2019-07-19 DIAGNOSIS — Z20828 Contact with and (suspected) exposure to other viral communicable diseases: Secondary | ICD-10-CM | POA: Diagnosis not present

## 2019-07-21 LAB — NOVEL CORONAVIRUS, NAA: SARS-CoV-2, NAA: NOT DETECTED

## 2019-08-04 DIAGNOSIS — Z1159 Encounter for screening for other viral diseases: Secondary | ICD-10-CM | POA: Diagnosis not present

## 2019-08-04 DIAGNOSIS — Z1231 Encounter for screening mammogram for malignant neoplasm of breast: Secondary | ICD-10-CM | POA: Diagnosis not present

## 2019-08-10 DIAGNOSIS — K449 Diaphragmatic hernia without obstruction or gangrene: Secondary | ICD-10-CM | POA: Diagnosis not present

## 2019-08-10 DIAGNOSIS — R110 Nausea: Secondary | ICD-10-CM | POA: Diagnosis not present

## 2019-09-06 DIAGNOSIS — Z794 Long term (current) use of insulin: Secondary | ICD-10-CM | POA: Diagnosis not present

## 2019-09-06 DIAGNOSIS — I700 Atherosclerosis of aorta: Secondary | ICD-10-CM | POA: Diagnosis not present

## 2019-09-06 DIAGNOSIS — E11319 Type 2 diabetes mellitus with unspecified diabetic retinopathy without macular edema: Secondary | ICD-10-CM | POA: Diagnosis not present

## 2019-09-06 DIAGNOSIS — K219 Gastro-esophageal reflux disease without esophagitis: Secondary | ICD-10-CM | POA: Diagnosis not present

## 2019-09-06 DIAGNOSIS — E785 Hyperlipidemia, unspecified: Secondary | ICD-10-CM | POA: Diagnosis not present

## 2019-09-06 DIAGNOSIS — N200 Calculus of kidney: Secondary | ICD-10-CM | POA: Diagnosis not present

## 2019-09-06 DIAGNOSIS — M8580 Other specified disorders of bone density and structure, unspecified site: Secondary | ICD-10-CM | POA: Diagnosis not present

## 2019-09-06 DIAGNOSIS — K5730 Diverticulosis of large intestine without perforation or abscess without bleeding: Secondary | ICD-10-CM | POA: Diagnosis not present

## 2019-12-05 DIAGNOSIS — R030 Elevated blood-pressure reading, without diagnosis of hypertension: Secondary | ICD-10-CM | POA: Diagnosis not present

## 2019-12-05 DIAGNOSIS — E109 Type 1 diabetes mellitus without complications: Secondary | ICD-10-CM | POA: Diagnosis not present

## 2019-12-05 DIAGNOSIS — Z794 Long term (current) use of insulin: Secondary | ICD-10-CM | POA: Diagnosis not present

## 2019-12-05 DIAGNOSIS — Z4681 Encounter for fitting and adjustment of insulin pump: Secondary | ICD-10-CM | POA: Diagnosis not present

## 2020-02-02 DIAGNOSIS — E10319 Type 1 diabetes mellitus with unspecified diabetic retinopathy without macular edema: Secondary | ICD-10-CM | POA: Diagnosis not present

## 2020-02-02 DIAGNOSIS — E7849 Other hyperlipidemia: Secondary | ICD-10-CM | POA: Diagnosis not present

## 2020-02-07 DIAGNOSIS — E7849 Other hyperlipidemia: Secondary | ICD-10-CM | POA: Diagnosis not present

## 2020-02-07 DIAGNOSIS — I700 Atherosclerosis of aorta: Secondary | ICD-10-CM | POA: Diagnosis not present

## 2020-02-07 DIAGNOSIS — K5730 Diverticulosis of large intestine without perforation or abscess without bleeding: Secondary | ICD-10-CM | POA: Diagnosis not present

## 2020-02-07 DIAGNOSIS — M859 Disorder of bone density and structure, unspecified: Secondary | ICD-10-CM | POA: Diagnosis not present

## 2020-02-07 DIAGNOSIS — K219 Gastro-esophageal reflux disease without esophagitis: Secondary | ICD-10-CM | POA: Diagnosis not present

## 2020-02-07 DIAGNOSIS — E10319 Type 1 diabetes mellitus with unspecified diabetic retinopathy without macular edema: Secondary | ICD-10-CM | POA: Diagnosis not present

## 2020-02-07 DIAGNOSIS — Z794 Long term (current) use of insulin: Secondary | ICD-10-CM | POA: Diagnosis not present

## 2020-02-07 DIAGNOSIS — N200 Calculus of kidney: Secondary | ICD-10-CM | POA: Diagnosis not present

## 2020-02-07 DIAGNOSIS — E11319 Type 2 diabetes mellitus with unspecified diabetic retinopathy without macular edema: Secondary | ICD-10-CM | POA: Diagnosis not present

## 2020-05-24 DIAGNOSIS — Z23 Encounter for immunization: Secondary | ICD-10-CM | POA: Diagnosis not present

## 2020-06-04 DIAGNOSIS — E10319 Type 1 diabetes mellitus with unspecified diabetic retinopathy without macular edema: Secondary | ICD-10-CM | POA: Diagnosis not present

## 2020-06-04 DIAGNOSIS — Z4681 Encounter for fitting and adjustment of insulin pump: Secondary | ICD-10-CM | POA: Diagnosis not present

## 2020-06-04 DIAGNOSIS — I10 Essential (primary) hypertension: Secondary | ICD-10-CM | POA: Diagnosis not present

## 2020-06-04 DIAGNOSIS — H10012 Acute follicular conjunctivitis, left eye: Secondary | ICD-10-CM | POA: Diagnosis not present

## 2020-06-04 DIAGNOSIS — Z794 Long term (current) use of insulin: Secondary | ICD-10-CM | POA: Diagnosis not present

## 2020-06-21 DIAGNOSIS — Z794 Long term (current) use of insulin: Secondary | ICD-10-CM | POA: Diagnosis not present

## 2020-06-21 DIAGNOSIS — Z4681 Encounter for fitting and adjustment of insulin pump: Secondary | ICD-10-CM | POA: Diagnosis not present

## 2020-06-21 DIAGNOSIS — E10319 Type 1 diabetes mellitus with unspecified diabetic retinopathy without macular edema: Secondary | ICD-10-CM | POA: Diagnosis not present

## 2020-06-21 DIAGNOSIS — E785 Hyperlipidemia, unspecified: Secondary | ICD-10-CM | POA: Diagnosis not present

## 2020-06-21 DIAGNOSIS — N200 Calculus of kidney: Secondary | ICD-10-CM | POA: Diagnosis not present

## 2020-06-21 DIAGNOSIS — I700 Atherosclerosis of aorta: Secondary | ICD-10-CM | POA: Diagnosis not present

## 2020-06-21 DIAGNOSIS — K219 Gastro-esophageal reflux disease without esophagitis: Secondary | ICD-10-CM | POA: Diagnosis not present

## 2020-06-21 DIAGNOSIS — K5730 Diverticulosis of large intestine without perforation or abscess without bleeding: Secondary | ICD-10-CM | POA: Diagnosis not present

## 2020-06-21 DIAGNOSIS — I10 Essential (primary) hypertension: Secondary | ICD-10-CM | POA: Diagnosis not present

## 2020-06-21 DIAGNOSIS — E668 Other obesity: Secondary | ICD-10-CM | POA: Diagnosis not present

## 2020-06-21 DIAGNOSIS — M8580 Other specified disorders of bone density and structure, unspecified site: Secondary | ICD-10-CM | POA: Diagnosis not present

## 2020-07-23 DIAGNOSIS — Z23 Encounter for immunization: Secondary | ICD-10-CM | POA: Diagnosis not present

## 2020-08-13 DIAGNOSIS — Z961 Presence of intraocular lens: Secondary | ICD-10-CM | POA: Diagnosis not present

## 2020-08-13 DIAGNOSIS — H52203 Unspecified astigmatism, bilateral: Secondary | ICD-10-CM | POA: Diagnosis not present

## 2020-08-13 DIAGNOSIS — H524 Presbyopia: Secondary | ICD-10-CM | POA: Diagnosis not present

## 2020-09-05 DIAGNOSIS — H532 Diplopia: Secondary | ICD-10-CM | POA: Diagnosis not present

## 2020-09-19 DIAGNOSIS — Z794 Long term (current) use of insulin: Secondary | ICD-10-CM | POA: Diagnosis not present

## 2020-09-19 DIAGNOSIS — I10 Essential (primary) hypertension: Secondary | ICD-10-CM | POA: Diagnosis not present

## 2020-09-19 DIAGNOSIS — Z4681 Encounter for fitting and adjustment of insulin pump: Secondary | ICD-10-CM | POA: Diagnosis not present

## 2020-09-19 DIAGNOSIS — E10319 Type 1 diabetes mellitus with unspecified diabetic retinopathy without macular edema: Secondary | ICD-10-CM | POA: Diagnosis not present

## 2020-09-26 DIAGNOSIS — H532 Diplopia: Secondary | ICD-10-CM | POA: Diagnosis not present

## 2020-10-10 DIAGNOSIS — H35352 Cystoid macular degeneration, left eye: Secondary | ICD-10-CM | POA: Diagnosis not present

## 2020-10-11 DIAGNOSIS — B079 Viral wart, unspecified: Secondary | ICD-10-CM | POA: Diagnosis not present

## 2020-10-11 DIAGNOSIS — D485 Neoplasm of uncertain behavior of skin: Secondary | ICD-10-CM | POA: Diagnosis not present

## 2020-10-11 DIAGNOSIS — L918 Other hypertrophic disorders of the skin: Secondary | ICD-10-CM | POA: Diagnosis not present

## 2020-10-11 DIAGNOSIS — L578 Other skin changes due to chronic exposure to nonionizing radiation: Secondary | ICD-10-CM | POA: Diagnosis not present

## 2020-10-11 DIAGNOSIS — Z85828 Personal history of other malignant neoplasm of skin: Secondary | ICD-10-CM | POA: Diagnosis not present

## 2020-10-11 DIAGNOSIS — L820 Inflamed seborrheic keratosis: Secondary | ICD-10-CM | POA: Diagnosis not present

## 2020-10-11 DIAGNOSIS — D2271 Melanocytic nevi of right lower limb, including hip: Secondary | ICD-10-CM | POA: Diagnosis not present

## 2020-10-11 DIAGNOSIS — L821 Other seborrheic keratosis: Secondary | ICD-10-CM | POA: Diagnosis not present

## 2020-10-11 DIAGNOSIS — D2262 Melanocytic nevi of left upper limb, including shoulder: Secondary | ICD-10-CM | POA: Diagnosis not present

## 2020-10-15 ENCOUNTER — Ambulatory Visit (INDEPENDENT_AMBULATORY_CARE_PROVIDER_SITE_OTHER): Payer: Medicare Other | Admitting: Ophthalmology

## 2020-10-15 ENCOUNTER — Other Ambulatory Visit: Payer: Self-pay

## 2020-10-15 ENCOUNTER — Encounter (INDEPENDENT_AMBULATORY_CARE_PROVIDER_SITE_OTHER): Payer: Self-pay | Admitting: Ophthalmology

## 2020-10-15 DIAGNOSIS — E113312 Type 2 diabetes mellitus with moderate nonproliferative diabetic retinopathy with macular edema, left eye: Secondary | ICD-10-CM | POA: Diagnosis not present

## 2020-10-15 DIAGNOSIS — H43822 Vitreomacular adhesion, left eye: Secondary | ICD-10-CM

## 2020-10-15 DIAGNOSIS — E113391 Type 2 diabetes mellitus with moderate nonproliferative diabetic retinopathy without macular edema, right eye: Secondary | ICD-10-CM | POA: Diagnosis not present

## 2020-10-15 DIAGNOSIS — R0683 Snoring: Secondary | ICD-10-CM | POA: Diagnosis not present

## 2020-10-15 DIAGNOSIS — H35352 Cystoid macular degeneration, left eye: Secondary | ICD-10-CM | POA: Diagnosis not present

## 2020-10-15 NOTE — Progress Notes (Signed)
10/15/2020     CHIEF COMPLAINT Patient presents for Retina Evaluation (NP-MAC EDEMA OS-ref'd by S. Gould////Pt reports some blur w/ glasses OS, no F/F OS, no pain or pressure OS. ////Last A1C: 6.7 taken 08/2020////Last BS: 112 this AM )   HISTORY OF PRESENT ILLNESS: Kelly Glover is a 70 y.o. female who presents to the clinic today for:   HPI    Retina Evaluation    In left eye.  Duration of 5 days. Additional comments: NP-MAC EDEMA OS-ref'd by S. Gould    Pt reports some blur w/ glasses OS, no F/F OS, no pain or pressure OS.     Last A1C: 6.7 taken 08/2020    Last BS: 112 this AM        Last edited by Nichola Sizer D on 10/15/2020  9:05 AM. (History)      Referring physician: Reynold Bowen, MD [place],  [place] [address]  HISTORICAL INFORMATION:   Selected notes from the [facility]: No current outpatient medications on file. (Ophthalmic Drugs)   No current facility-administered medications for this visit. (Ophthalmic Drugs)   Current Outpatient Medications (Other)  Medication Sig  . amoxicillin-clavulanate (AUGMENTIN) 875-125 MG tablet Take 1 tablet by mouth 2 (two) times daily.  Marland Kitchen aspirin EC 81 MG tablet Take 81 mg by mouth daily.  . cetirizine (ZYRTEC) 10 MG tablet Take 10 mg by mouth daily.  . Cholecalciferol (VITAMIN D) 2000 UNITS CAPS Take 1 capsule by mouth daily.  Marland Kitchen estradiol (ESTRACE VAGINAL) 0.1 MG/GM vaginal cream Place 1/2 gm per vagina nightly for 2 weeks and then 1/2 gm per vagina twice weekly.  Marland Kitchen estradiol (ESTRACE) 2 MG tablet   . ezetimibe (ZETIA) 10 MG tablet Take 10 mg by mouth daily.  . famotidine (PEPCID AC) 10 MG chewable tablet Chew 10 mg by mouth daily.   Marland Kitchen FOLIC ACID PO Take 323 mcg by mouth daily.   Marland Kitchen HYDROcodone-acetaminophen (NORCO/VICODIN) 5-325 MG tablet Take 1 tablet by mouth every 4 (four) hours as needed.  . Ibuprofen-Diphenhydramine Cit (ADVIL PM PO) Take 0.5 tablets by  mouth daily as needed (sleep).  . JARDIANCE 25 MG TABS tablet Take 25 mg by mouth daily.  . metroNIDAZOLE (FLAGYL) 500 MG tablet Take 1 tablet (500 mg total) by mouth 3 (three) times daily.  Marland Kitchen NOVOLOG 100 UNIT/ML injection Inject into the skin as directed. Insulin pump  . omeprazole-sodium bicarbonate (ZEGERID) 40-1100 MG per capsule Take 1 capsule by mouth daily before breakfast.  . ondansetron (ZOFRAN ODT) 4 MG disintegrating tablet Take 1 tablet (4 mg total) by mouth every 8 (eight) hours as needed for nausea.  . ondansetron (ZOFRAN) 4 MG tablet Take 1 tablet (4 mg total) by mouth every 6 (six) hours.  . ONE TOUCH ULTRA TEST test strip   . ramipril (ALTACE) 2.5 MG capsule Take 1 capsule by mouth daily.  . rosuvastatin (CRESTOR) 40 MG tablet Take 40 mg by mouth daily.   . sertraline (ZOLOFT) 100 MG tablet Take 50 mg by mouth daily.    No current facility-administered medications for this visit. (Other)      REVIEW OF SYSTEMS:    ALLERGIES Allergies  Allergen Reactions  . Diflucan [Fluconazole]     Itching, palms red, throat itchy.   Elyse Hsu [Shellfish Allergy] Itching    Redness, throat constriction  . Percocet [Oxycodone-Acetaminophen] Other (See Comments)    Diaphoresis, nausea, confusion.   Marland Kitchen  Doxycycline Itching and Rash    Itchy throat  . Griseofulvin Rash    PAST MEDICAL HISTORY Past Medical History:  Diagnosis Date  . Diabetes ([place])   . History of diverticulitis   . History of kidney stones   . 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 Neuromuscular disorder ([place])    inappropriate mast cell response disorder   . PONV (postoperative nausea and vomiting)   . Squamous cell carcinoma of left lower leg 2015   Past Surgical History:  Procedure Laterality Date  . BREAST BIOPSY Left ?   benign  . DIAGNOSTIC LAPAROSCOPY    . RECTOCELE REPAIR N/A 09/17/2014   Procedure: POSTERIOR REPAIR , enterocele repair;  Surgeon: Jamey Reas de  Berton Lan, MD;  Location: [facility];  Service: Gynecology;  Laterality: N/A;  . TONSILLECTOMY  childhood  . TOTAL ABDOMINAL HYSTERECTOMY W/ BILATERAL SALPINGOOPHORECTOMY  05/20/98    FAMILY HISTORY Family History  Problem Relation Age of Onset  . Diabetes Mother   . Heart disease Mother   . Heart disease Father   . Thyroid disease Sister   . Asthma Maternal Grandmother 91  . Diabetes Maternal Grandmother   . Breast cancer Maternal Grandmother   . Diabetes Maternal Grandfather   . Graves' disease Son   . Thyroid disease Son     SOCIAL HISTORY Social History   Tobacco Use  . Smoking status: Never Smoker  . Smokeless tobacco: Never Used  Substance Use Topics  . Alcohol use: No    Alcohol/week: 0.0 standard drinks  . Drug use: No         OPHTHALMIC EXAM:  Base Eye Exam    Visual Acuity (ETDRS)      Right Left   Dist cc 20/30 +1 20/30 +1   Dist ph cc 20/25 +2 20/25 -1       Tonometry (Tonopen, 9:13 AM)      Right Left   Pressure 14 17       Pupils      Pupils Dark Light Shape React APD   Right PERRL 5 4 Round Brisk None   Left PERRL 5 4 Round Brisk None       Visual Fields (Counting fingers)      Left Right    Full Full       Extraocular Movement      Right Left    Full Full       Neuro/Psych    Oriented x3: Yes       Dilation    Both eyes: 1.0% Mydriacyl, 2.5% Phenylephrine @ 9:13 AM        Slit Lamp and Fundus Exam    External Exam      Right Left   External Normal Normal       Slit Lamp Exam      Right Left   Lids/Lashes Normal Normal   Conjunctiva/Sclera White and quiet White and quiet   Cornea Clear Clear   Anterior Chamber Deep and quiet Deep and quiet   Iris Round and reactive Round and reactive   Lens Centered posterior chamber intraocular lens Centered posterior chamber intraocular lens   Anterior Vitreous Normal Normal       Fundus Exam      Right Left   Posterior Vitreous Posterior vitreous detachment Posterior  vitreous detachment   Disc Normal Normal   C/D Ratio 0.35 0.35   Macula Normal Microaneurysms  Vessels NPDR- Moderate NPDR- Moderate   Periphery Normal Normal          IMAGING AND PROCEDURES  Imaging and Procedures for 10/15/20  OCT, Retina - OU - Both Eyes       Right Eye Quality was good. Scan locations included subfoveal. Central Foveal Thickness: 260. Progression has no prior data. Findings include normal foveal contour.   Left Eye Quality was good. Scan locations included subfoveal. Central Foveal Thickness: 299. Progression has no prior data. Findings include abnormal foveal contour, vitreomacular adhesion , vitreous traction.                 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 In a similar fashion, Diabetes damages the blood supply of the body by cumulative long--term elevated blood sugar, which corrodes the blood supply in the body, particularly the blood vessels supplying the retina, kidneys, and nerves".  Thus, control of blood sugar, slows the progression of the corrosive effect of diabetes mellitus.   Minor perifoveal CSME inferior to the foveal avascular zone OS, no real impact associated vitreous traction OS.  Will need to evaluate properly with fluorescein angiography left and right in order to assess whether there is widespread diabetic retinopathy or could this in fact be macular telangiectasis.        ICD-10-CM   1. Cystoid macular edema of left eye  H35.352 OCT, Retina - OU - Both Eyes  2. Vitreomacular adhesion of  left eye  H43.822 OCT, Retina - OU - Both Eyes  3. 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 Snores  R06.83   5. Moderate nonproliferative diabetic retinopathy of right eye without macular edema associated with type 2 diabetes mellitus ([place])  J57.0177     1.  OS most likely with diabetic CSME with moderate nonproliferative diabetic retinopathy given the duration of diabetic disease.  Nonetheless macular telangiectasis is in the differential diagnosis.  Patient does snore but other review of systems for sleep apnea are mostly negative.  Patient is a side sleeper  2.  OS with vitreal foveal adhesion yet not at the site of CME inferior to the fovea thus not likely in etiological concern for the CME inferiorly.  3.  Patient has a unusual mast cell degranulation disorder, thus we will not proceed with fluorescein angiography at today's visit and we will premedicate with oral prednisone 30 mg the night before and previous night, before the next visit and fluorescein angiography is performed   4.  Lengthy discussion regarding the effects of nightly hypoxic disease should it be present in the potential of sleep apnea complicating otherwise routine diabetic CSME, clinically significant macular edema. Ophthalmic Meds Ordered this visit:   Patient instructed use 30 mg prednisone, 2 nights before as well as the night before the next visit No orders of the defined types were placed in this encounter.      Return in about 3 weeks (around 11/05/2020) for DILATE OU, COLOR FP, OPTOS FFA L/R.  Patient Instructions  Patient has medication at home  Prednisone 30 mg 2 nights before next visit and night before next visit    Explained the diagnoses, plan, and follow up with the patient and they expressed understanding.  Patient expressed understanding of the importance of proper  follow up care.   Clent Demark Idamae Coccia M.D. Diseases & Surgery of the Retina and Vitreous Retina & Diabetic [place] 10/15/20     Abbreviations: M myopia (nearsighted); A astigmatism; H hyperopia (farsighted); P presbyopia; Mrx spectacle prescription;  CTL contact lenses; OD right eye; OS left eye; OU both eyes  XT exotropia; ET esotropia; PEK punctate epithelial keratitis; PEE punctate epithelial erosions; DES dry eye syndrome; MGD meibomian gland dysfunction; ATs artificial tears; PFAT's preservative free artificial tears; [place] nuclear sclerotic cataract; PSC posterior subcapsular cataract; ERM epi-retinal membrane; PVD posterior vitreous detachment; RD retinal detachment; DM diabetes mellitus; DR diabetic retinopathy; NPDR non-proliferative diabetic retinopathy; PDR proliferative diabetic retinopathy; CSME clinically significant macular edema; DME diabetic macular edema; dbh dot blot hemorrhages; CWS cotton wool spot; POAG primary open angle glaucoma; C/D cup-to-disc ratio; HVF humphrey visual field; GVF goldmann visual field; OCT optical coherence tomography; IOP intraocular pressure; BRVO Branch retinal vein occlusion; CRVO central retinal vein occlusion; CRAO central retinal artery occlusion; BRAO branch retinal artery occlusion; RT retinal tear; SB scleral buckle; PPV pars plana vitrectomy; VH Vitreous hemorrhage; PRP panretinal laser photocoagulation; IVK intravitreal kenalog; VMT vitreomacular traction; MH Macular hole;  NVD neovascularization of the disc; NVE neovascularization elsewhere; AREDS age related eye disease study; ARMD age related macular degeneration; POAG primary open angle glaucoma; EBMD epithelial/anterior basement membrane dystrophy; ACIOL anterior chamber intraocular lens; IOL intraocular lens; PCIOL posterior chamber intraocular lens; Phaco/IOL phacoemulsification with intraocular lens placement; [place] photorefractive keratectomy; LASIK laser assisted in situ keratomileusis; HTN  hypertension; DM diabetes mellitus; COPD chronic obstructive pulmonary disease

## 2020-10-15 NOTE — Patient Instructions (Signed)
Patient has medication at home  Prednisone 30 mg 2 nights before next visit and night before next visit

## 2020-10-15 NOTE — Assessment & Plan Note (Addendum)
The nature of diabetic retinopathy was explained using the following analogy: "Retinopathy develops in the body's blood supply like salty water corrodes the linings of pipes in a house, until rust appears, then holes in the pipes develop which leak followed by destruction and loss of the pipes as the corrosion turns them to dust. In a similar fashion, Diabetes damages the blood supply of the body by cumulative long-term elevated blood sugar, which corrodes the blood supply in the body, particularly the blood vessels supplying the retina, kidneys, and nerves".  Thus, control of blood sugar, slows the progression of the corrosive effect of diabetes mellitus.   Minor perifoveal CSME inferior to the foveal avascular zone OS, no real impact associated vitreous traction OS.  Will need to evaluate properly with fluorescein angiography left and right in order to assess whether there is widespread diabetic retinopathy or could this in fact be macular telangiectasis.

## 2020-10-17 ENCOUNTER — Encounter (INDEPENDENT_AMBULATORY_CARE_PROVIDER_SITE_OTHER): Payer: Medicare Other | Admitting: Ophthalmology

## 2020-11-05 ENCOUNTER — Other Ambulatory Visit (INDEPENDENT_AMBULATORY_CARE_PROVIDER_SITE_OTHER): Payer: Self-pay

## 2020-11-05 ENCOUNTER — Ambulatory Visit (INDEPENDENT_AMBULATORY_CARE_PROVIDER_SITE_OTHER): Payer: Medicare Other | Admitting: Ophthalmology

## 2020-11-05 ENCOUNTER — Other Ambulatory Visit: Payer: Self-pay

## 2020-11-05 DIAGNOSIS — E113391 Type 2 diabetes mellitus with moderate nonproliferative diabetic retinopathy without macular edema, right eye: Secondary | ICD-10-CM

## 2020-11-05 DIAGNOSIS — H35072 Retinal telangiectasis, left eye: Secondary | ICD-10-CM | POA: Diagnosis not present

## 2020-11-05 DIAGNOSIS — G4733 Obstructive sleep apnea (adult) (pediatric): Secondary | ICD-10-CM

## 2020-11-05 DIAGNOSIS — H35352 Cystoid macular degeneration, left eye: Secondary | ICD-10-CM | POA: Diagnosis not present

## 2020-11-05 DIAGNOSIS — E113312 Type 2 diabetes mellitus with moderate nonproliferative diabetic retinopathy with macular edema, left eye: Secondary | ICD-10-CM | POA: Diagnosis not present

## 2020-11-05 NOTE — Assessment & Plan Note (Signed)
I will recommend urgent formal sleep study possibly at home, via [facility] specifically Dr. Asencion Partridge Dohmeier and we will contact that office today.

## 2020-11-05 NOTE — Assessment & Plan Note (Signed)
We will arrange rather urgent evaluation with Dr., Larey Seat,  [facility]

## 2020-11-05 NOTE — Progress Notes (Addendum)
11/05/2020     CHIEF COMPLAINT Patient presents for Retina Follow Up (FFA L/R. FP/Pt states no changes since last visit. Taking medication as directed./BGL: 156)   HISTORY OF PRESENT ILLNESS: Kelly Glover is a 70 y.o. female who presents to the clinic today for:   HPI    Retina Follow Up    Diagnosis: CME.  In left eye.  Severity is moderate.  Duration of 3 weeks.  Since onset it is stable.  I, the attending physician,  performed the HPI with the patient and updated documentation appropriately. Additional comments: FFA L/R. FP Pt states no changes since last visit. Taking medication as directed. BGL: 156       Last edited by Tilda Franco on 11/05/2020  8:09 AM. (History)      Referring physician: Reynold Bowen, MD [place],  [place] [address]  HISTORICAL INFORMATION:   Selected notes from the [facility]: No current outpatient medications on file. (Ophthalmic Drugs)   No current facility-administered medications for this visit. (Ophthalmic Drugs)   Current Outpatient Medications (Other)  Medication Sig   amoxicillin-clavulanate (AUGMENTIN) 875-125 MG tablet Take 1 tablet by mouth 2 (two) times daily.   aspirin EC 81 MG tablet Take 81 mg by mouth daily.   cetirizine (ZYRTEC) 10 MG tablet Take 10 mg by mouth daily.   Cholecalciferol (VITAMIN D) 2000 UNITS CAPS Take 1 capsule by mouth daily.   estradiol (ESTRACE VAGINAL) 0.1 MG/GM vaginal cream Place 1/2 gm per vagina nightly for 2 weeks and then 1/2 gm per vagina twice weekly.   estradiol (ESTRACE) 2 MG tablet    ezetimibe (ZETIA) 10 MG tablet Take 10 mg by mouth daily.   famotidine (PEPCID AC) 10 MG chewable tablet Chew 10 mg by mouth daily.    FOLIC ACID PO Take 768 mcg by mouth daily.    HYDROcodone-acetaminophen (NORCO/VICODIN) 5-325 MG tablet Take 1 tablet by mouth every 4 (four) hours as needed.   Ibuprofen-Diphenhydramine Cit (ADVIL PM PO) Take 0.5  tablets by mouth daily as needed (sleep).   JARDIANCE 25 MG TABS tablet Take 25 mg by mouth daily.   metroNIDAZOLE (FLAGYL) 500 MG tablet Take 1 tablet (500 mg total) by mouth 3 (three) times daily.   NOVOLOG 100 UNIT/ML injection Inject into the skin as directed. Insulin pump   omeprazole-sodium bicarbonate (ZEGERID) 40-1100 MG per capsule Take 1 capsule by mouth daily before breakfast.   ondansetron (ZOFRAN ODT) 4 MG disintegrating tablet Take 1 tablet (4 mg total) by mouth every 8 (eight) hours as needed for nausea.   ondansetron (ZOFRAN) 4 MG tablet Take 1 tablet (4 mg total) by mouth every 6 (six) hours.   ONE TOUCH ULTRA TEST test strip    ramipril (ALTACE) 2.5 MG capsule Take 1 capsule by mouth daily.   rosuvastatin (CRESTOR) 40 MG tablet Take 40 mg by mouth daily.    sertraline (ZOLOFT) 100 MG tablet Take 50 mg by mouth daily.    No current facility-administered medications for this visit. (Other)      REVIEW OF SYSTEMS:    ALLERGIES Allergies  Allergen Reactions   Diflucan [Fluconazole]     Itching, palms red, throat itchy.    Scallops [Shellfish Allergy] Itching    Redness, throat constriction   Percocet [Oxycodone-Acetaminophen] Other (See Comments)    Diaphoresis, nausea, confusion.    Doxycycline Itching and Rash    Itchy throat  Griseofulvin Rash    PAST MEDICAL HISTORY Past Medical History:  Diagnosis Date   Diabetes ([place])    History of diverticulitis    History of kidney stones    Mast cell disorder    inappropriate mast cell activation   Neuromuscular disorder ([place])    inappropriate mast cell response disorder    PONV (postoperative nausea and vomiting)    Squamous cell carcinoma of left lower leg 2015   Past Surgical History:  Procedure Laterality Date   BREAST BIOPSY Left ?   benign   DIAGNOSTIC LAPAROSCOPY     RECTOCELE REPAIR N/A 09/17/2014   Procedure: POSTERIOR REPAIR , enterocele repair;  Surgeon: Jamey Reas  de Berton Lan, MD;  Location: [facility];  Service: Gynecology;  Laterality: N/A;   TONSILLECTOMY  childhood   TOTAL ABDOMINAL HYSTERECTOMY W/ BILATERAL SALPINGOOPHORECTOMY  05/20/98    FAMILY HISTORY Family History  Problem Relation Age of Onset   Diabetes Mother    Heart disease Mother    Heart disease Father    Thyroid disease Sister    Asthma Maternal Grandmother 38   Diabetes Maternal Grandmother    Breast cancer Maternal Grandmother    Diabetes Maternal Grandfather    Graves' disease Son    Thyroid disease Son     SOCIAL HISTORY Social History   Tobacco Use   Smoking status: Never Smoker   Smokeless tobacco: Never Used  Substance Use Topics   Alcohol use: No    Alcohol/week: 0.0 standard drinks   Drug use: No         OPHTHALMIC EXAM:  Base Eye Exam    Visual Acuity (Snellen - Linear)      Right Left   Dist cc 20/30 -1 20/30 +2   Correction: Glasses       Tonometry (Tonopen, 8:13 AM)      Right Left   Pressure 14 17       Neuro/Psych    Oriented x3: Yes   Mood/Affect: Normal       Dilation    Both eyes: 1.0% Mydriacyl, 2.5% Phenylephrine @ 8:13 AM        Slit Lamp and Fundus Exam    External Exam      Right Left   External Normal Normal       Slit Lamp Exam      Right Left   Lids/Lashes Normal Normal   Conjunctiva/Sclera White and quiet White and quiet   Cornea Clear Clear   Anterior Chamber Deep and quiet Deep and quiet   Iris Round and reactive Round and reactive   Lens Centered posterior chamber intraocular lens Centered posterior chamber intraocular lens   Anterior Vitreous Normal Normal          IMAGING AND PROCEDURES  Imaging and Procedures for 11/06/20  Color Fundus Photography Optos - OU - Both Eyes       Right Eye Progression has no prior data. Disc findings include normal observations. Macula : microaneurysms. Periphery : normal observations.   Left Eye Progression has no prior data. Disc  findings include normal observations. Macula : microaneurysms. Periphery : normal observations.   Notes Left eye.  Incidental posterior vitreous detachment noted   OD, and OS with mild nonproliferative diabetic retinopathy detected by colors only       Fluorescein Angiography Optos (Transit OS)       Right Eye   Progression has no prior data. Mid/Late phase findings include microaneurysm. Choroidal  neovascularization is not present.   Left Eye   Progression has no prior data. Early phase findings include microaneurysm. Mid/Late phase findings include microaneurysm, leakage. Choroidal neovascularization is not present.   Notes OS, and OD with overall moderate nonproliferative diabetic retinopathy with scattered microaneurysms throughout the posterior pole.  Inferior perifoveal capillary network of multifocal microaneurysms with diffuse leakage at the 6:00 meridian to the FAZ OS, this corresponds with the area of microvascular leakage on OCT seen on previous examination and today.                ASSESSMENT/PLAN:  Type 2 macular telangiectasis, left I will recommend urgent formal sleep study possibly at home, via [facility] specifically Dr. Asencion Partridge Dohmeier and we will contact that office today.  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 Good blood glucose, blood pressure, and serum lipid control was recommended as well as avoidance of smoking and maintenance of normal weight.  Close follow up with PCP encouraged.  Moderate nonproliferative diabetic retinopathy  found mostly on peripheral fundus fluorescein angiography.  Not evident directly on clinical examination  Small cluster of microaneurysms inferior to the fovea with vascular wall staining could in fact be on moderate nonproliferative diabetic retinopathy with secondary CSME yet with vascular longstanding and the lack of extensive retinopathy this is more likely some damage from macular telangiectasis in the hypoxic damage which occurs at night with periodic episodic hypertensive effects from obstructive sleep apnea  Moderate nonproliferative diabetic retinopathy of right eye ([place]) Seen on fundus fluorescein angiography      ICD-10-CM   1. Cystoid macular edema of left eye  H35.352 Color Fundus Photography Optos - OU - Both Eyes    Fluorescein Angiography Optos (Transit OS)  2. Type 2 macular telangiectasis, left  H35.072   3. Obstructive sleep apnea syndrome in adult  G47.33   4. 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 Moderate nonproliferative diabetic retinopathy of right eye without macular edema associated with type 2 diabetes mellitus ([place])  E33.2951     1.  The side effects of potential treatment of obstructive sleep apnea reviewed with the patient.  These would be improved sense of wellbeing, improved sleep, less napping the next day, potentially less  Microvascular leakage around the center of the vision in this left eye which has I believe potential for macular telangiectasis OS.  2.  Patient will not begin treatment for this as possible diabetic retinopathy because Of the untreated state of sleep apnea.  3.  Should sleep apnea be treated in a formal fashion, we will reevaluate the OCT and look for resolution of the CME with this therapy.  I explained the patient we have options because of the excellent vision at this time.  We have options to treat as diabetic macular edema should  treatment of sleep apnea not improve the anatomic findings.  Ophthalmic Meds Ordered this visit:  No orders of the defined types were placed in this encounter.      Return in about 6 weeks (around 12/17/2020) for DILATE OU, OCT.  There  are no Patient Instructions on file for this visit.   Explained the diagnoses, plan, and follow up with the patient and they expressed understanding.  Patient expressed understanding of the importance of proper follow up care.   Clent Demark Keiji Melland M.D. Diseases & Surgery of the Retina and Vitreous [facility] 11/06/20     Abbreviations: M myopia (nearsighted); A astigmatism; H hyperopia (farsighted); P presbyopia; Mrx spectacle prescription;  CTL contact lenses; OD right eye; OS left eye; OU both eyes  XT exotropia; ET esotropia; PEK punctate epithelial keratitis; PEE punctate epithelial erosions; DES dry eye syndrome; MGD meibomian gland dysfunction; ATs artificial tears; PFAT's preservative free artificial tears; [place] nuclear sclerotic cataract; PSC posterior subcapsular cataract; ERM epi-retinal membrane; PVD posterior vitreous detachment; RD retinal detachment; DM diabetes mellitus; DR diabetic retinopathy; NPDR non-proliferative diabetic retinopathy; PDR proliferative diabetic retinopathy; CSME clinically significant macular edema; DME diabetic macular edema; dbh dot blot hemorrhages; CWS cotton wool spot; POAG primary open angle glaucoma; C/D cup-to-disc ratio; HVF humphrey visual field; GVF goldmann visual field; OCT optical coherence tomography; IOP intraocular pressure; BRVO Branch retinal vein occlusion; CRVO central retinal vein occlusion; CRAO central retinal artery occlusion; BRAO branch retinal artery occlusion; RT retinal tear; SB scleral buckle; PPV pars plana vitrectomy; VH Vitreous hemorrhage; PRP panretinal laser photocoagulation; IVK intravitreal kenalog; VMT vitreomacular traction; MH Macular hole;  NVD neovascularization of the  disc; NVE neovascularization elsewhere; AREDS age related eye disease study; ARMD age related macular degeneration; POAG primary open angle glaucoma; EBMD epithelial/anterior basement membrane dystrophy; ACIOL anterior chamber intraocular lens; IOL intraocular lens; PCIOL posterior chamber intraocular lens; Phaco/IOL phacoemulsification with intraocular lens placement; [place] photorefractive keratectomy; LASIK laser assisted in situ keratomileusis; HTN hypertension; DM diabetes mellitus; COPD chronic obstructive pulmonary disease

## 2020-11-06 NOTE — Assessment & Plan Note (Signed)
Seen on fundus fluorescein angiography

## 2020-11-06 NOTE — Assessment & Plan Note (Signed)
The nature of moderate nonproliferative diabetic retinopathy was discussed with the patient as well as the need for more frequent follow up to judge for progression. Good blood glucose, blood pressure, and serum lipid control was recommended as well as avoidance of smoking and maintenance of normal weight.  Close follow up with PCP encouraged.  Moderate nonproliferative diabetic retinopathy found mostly on peripheral fundus fluorescein angiography.  Not evident directly on clinical examination  Small cluster of microaneurysms inferior to the fovea with vascular wall staining could in fact be on moderate nonproliferative diabetic retinopathy with secondary CSME yet with vascular longstanding and the lack of extensive retinopathy this is more likely some damage from macular telangiectasis in the hypoxic damage which occurs at night with periodic episodic hypertensive effects from obstructive sleep apnea

## 2020-11-07 ENCOUNTER — Other Ambulatory Visit: Payer: Self-pay

## 2020-11-07 ENCOUNTER — Ambulatory Visit (INDEPENDENT_AMBULATORY_CARE_PROVIDER_SITE_OTHER): Payer: Medicare Other | Admitting: Neurology

## 2020-11-07 ENCOUNTER — Encounter: Payer: Self-pay | Admitting: Neurology

## 2020-11-07 VITALS — BP 126/72 | HR 65 | Ht 66.0 in | Wt 175.0 lb

## 2020-11-07 DIAGNOSIS — Z9189 Other specified personal risk factors, not elsewhere classified: Secondary | ICD-10-CM | POA: Diagnosis not present

## 2020-11-07 DIAGNOSIS — E113312 Type 2 diabetes mellitus with moderate nonproliferative diabetic retinopathy with macular edema, left eye: Secondary | ICD-10-CM | POA: Diagnosis not present

## 2020-11-07 DIAGNOSIS — Z9641 Presence of insulin pump (external) (internal): Secondary | ICD-10-CM | POA: Diagnosis not present

## 2020-11-07 DIAGNOSIS — R0683 Snoring: Secondary | ICD-10-CM

## 2020-11-07 NOTE — Patient Instructions (Signed)

## 2020-11-07 NOTE — Progress Notes (Signed)
SLEEP MEDICINE CLINIC    Provider:  Larey Seat, MD  Primary Care Physician:  Reynold Bowen, [place] [address]     Referring Provider: Dr Deloria Lair, MD         Chief Complaint according to patient   Patient presents with:    . New Patient (Initial Visit)           HISTORY OF PRESENT ILLNESS:  Kelly Glover is a 70 y.o. year old White or Caucasian female patient seen here as a referral on 11/07/2020 from Dr Zadie Rhine, concerned about hypoxemia at night , measured by ring, and cystoid macular edema. .  Chief concern according to patient :  Rm 11 with husband, Dr. Harrington Challenger.  Pt reports she is here to discuss sleep study per Dr. 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 She denies prior sleep study and reports she does snore at night.    I have the pleasure of seeing Kelly Glover today, a right -handed Caucasian female with a possible sleep disorder.  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     Sleep relevant medical history: Nocturia once at 4.30 AM. Family medical /sleep history: 2 sisters  suspected to have OSA, no insomnia, no sleep walkers.    Social history:  adult children , some out of state. Husband is MD> Patient is retired, homemaker, and lives in a household with 2 persons, 3 grandchildren visit often.  Tobacco use; never.  ETOH use ; none ,  Caffeine intake in form of Coffee( all day, 2 cups  In AM one at lunch).         Sleep habits are as follows: The patient's dinner time is between 5-7 PM. The patient goes to bed at 10.30 PM and continues to sleep for 5 hours, wakes for one bathroom breaks.  She likes the  bedroom cool, quiet and dark.   The preferred sleep position is side , with the support of 2 pillows.  Dreams are reportedly infrequent/vivid.  7 AM is the usual rise time. The patient wakes up spontaneously.   She reports  feeling refreshed and restored in AM, lately she had headaches in AM with symptoms such as dry mouth, morning headaches. No Naps are taken.   Review of Systems: Out of a complete 14 system review, the patient complains of only the following symptoms, and all other reviewed systems are negative.:   loud snoring,   How likely are you to doze in the following situations: 0 = not likely, 1 = slight chance, 2 = moderate chance, 3 = high chance   Sitting and Reading? Watching Television? Sitting inactive in a public place (theater or meeting)? As a passenger in a car for an hour without a break? Lying down in the afternoon when circumstances permit? Sitting and talking to someone? Sitting quietly after lunch without alcohol? In a car, while stopped for a few minutes in traffic?   Total = 2/ 24 points   FSS endorsed at 12/ 63 points.   Social History   Socioeconomic History  . Marital status: Married    Spouse name: Not on file  . Number of  children: Not on file  . Years of education: Not on file  . Highest education level: Not on file  Occupational History  . Not on file  Tobacco Use  . Smoking status: Never Smoker  . Smokeless tobacco: Never Used  Substance and Sexual Activity  . Alcohol use: No    Alcohol/week: 0.0 standard drinks  . Drug use: No  . Sexual activity: Yes    Partners: Male    Birth control/protection: Surgical    Comment: hysterectomy  Other Topics Concern  . Not on file  Social History Narrative  . Not on file   Social Determinants of Health   Financial Resource Strain: Not on file  Food Insecurity: Not on file  Transportation Needs: Not on file  Physical Activity: Not on file  Stress: Not on file  Social Connections: Not on  file    Family History  Problem Relation Age of Onset  . Diabetes Mother   . Heart disease Mother   . Heart disease Father   . Thyroid disease Sister   . Asthma Maternal Grandmother 2  . Diabetes Maternal Grandmother   . Breast cancer Maternal Grandmother   . Diabetes Maternal Grandfather   . Graves' disease Son   . Thyroid disease Son     Past Medical History:  Diagnosis Date  . Diabetes ([place])   . History of diverticulitis   . History of kidney stones   . 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 Neuromuscular disorder ([place])    inappropriate mast cell response disorder   . PONV (postoperative nausea and vomiting)   . Squamous cell carcinoma of left lower leg 2015    Past Surgical History:  Procedure Laterality Date  . BREAST BIOPSY Left ?   benign  . DIAGNOSTIC LAPAROSCOPY    . RECTOCELE REPAIR N/A 09/17/2014   Procedure: POSTERIOR REPAIR , enterocele repair;  Surgeon: Jamey Reas de Berton Lan, MD;  Location: [facility];  Service: Gynecology;  Laterality: N/A;  . TONSILLECTOMY  childhood  . TOTAL ABDOMINAL HYSTERECTOMY W/ BILATERAL SALPINGOOPHORECTOMY  05/20/98     Current Outpatient Medications on File Prior to Visit  Medication Sig Dispense Refill  . aspirin EC 81 MG tablet Take 81 mg by mouth daily.    . cetirizine (ZYRTEC) 10 MG tablet Take 10 mg by mouth daily.    . Cholecalciferol (VITAMIN D) 2000 UNITS CAPS Take 1 capsule by mouth daily.    Marland Kitchen estradiol (ESTRACE) 2 MG tablet   0  . ezetimibe (ZETIA) 10 MG tablet Take 10 mg by mouth daily.  0  . famotidine (PEPCID AC) 10 MG chewable tablet Chew 10 mg by mouth daily.     Marland Kitchen FOLIC ACID PO Take 188 mcg by mouth daily.     Marland Kitchen HYDROcodone-acetaminophen (NORCO/VICODIN) 5-325 MG tablet Take 1 tablet by mouth every 4 (four) hours as needed. 10 tablet 0  . Ibuprofen-Diphenhydramine Cit (ADVIL PM PO) Take 0.5 tablets by mouth daily as needed (sleep).    . JARDIANCE 25 MG TABS tablet Take 25 mg  by mouth daily.    Marland Kitchen NOVOLOG 100 UNIT/ML injection Inject into the skin as directed. Insulin pump    . omeprazole-sodium bicarbonate (ZEGERID) 40-1100 MG per capsule Take 1 capsule by mouth daily before breakfast.    . ondansetron (ZOFRAN ODT) 4 MG disintegrating tablet Take 1 tablet (4 mg total) by mouth every 8 (eight) hours as needed for nausea. 6  tablet 0  . ONE TOUCH ULTRA TEST test strip     . ramipril (ALTACE) 2.5 MG capsule Take 1 capsule by mouth daily.    . rosuvastatin (CRESTOR) 40 MG tablet Take 40 mg by mouth daily.     . sertraline (ZOLOFT) 100 MG tablet Take 50 mg by mouth daily.      No current facility-administered medications on file prior to visit.    Allergies  Allergen Reactions  . Diflucan [Fluconazole]     Itching, palms red, throat itchy.   Elyse Hsu [Shellfish Allergy] Itching    Redness, throat constriction  . Percocet [Oxycodone-Acetaminophen] Other (See Comments)    Diaphoresis, nausea, confusion.   . Doxycycline Itching and Rash    Itchy throat  . Griseofulvin Rash    Physical exam:  Today's Vitals   11/07/20 1442  BP: 126/72  Pulse: 65  SpO2: 96%  Weight: 175 lb (79.4 kg)  Height: 5' 6"  (1.676 m)   Body mass index is 28.25 kg/m.   Wt Readings from Last 3 Encounters:  11/07/20 175 lb (79.4 kg)  06/26/17 165 lb (74.8 kg)  08/29/16 180 lb (81.6 kg)     Ht Readings from Last 3 Encounters:  11/07/20 5' 6"  (1.676 m)  06/26/17 5' 6"  (1.676 m)  11/19/16 5' 6"  (1.676 m)      General: The patient is awake, alert and appears not in acute distress. The patient is well groomed. Head: Normocephalic, atraumatic. Neck is supple. Mallampati 1-2 neck circumference: 16 inches . Nasal airflow patent.  Retrognathia is not seen.  Dental status:  Cardiovascular:  Regular rate and cardiac rhythm by pulse,  without distended neck veins. Respiratory: Lungs are clear to auscultation.  Skin:  Without evidence of ankle edema, or rash. Trunk: The patient's  posture is erect.   Neurologic exam : The patient is awake and alert, oriented to place and time.   Memory subjective described as intact.  Attention span & concentration ability appears normal.  Speech is fluent,  without  dysarthria, dysphonia or aphasia.  Mood and affect are appropriate.   Cranial nerves: no loss of smell or taste reported  Pupils are equal and briskly reactive to light. Funduscopic exam deferred. .  Extraocular movements in vertical and horizontal planes were intact and without nystagmus.  No Diplopia. Visual fields by finger perimetry are intact. Hearing was intact to soft voice and finger rubbing.    Facial sensation intact to fine touch.  Facial motor strength is symmetric and tongue and uvula move midline.  Neck ROM : rotation, tilt and flexion extension were normal for age and shoulder shrug was symmetrical.    Motor exam:  Symmetric bulk, tone and ROM.   Normal tone without cog -wheeling, symmetric grip strength .   Sensory:  temperature, pressure and vibration were tested - she felt no vibration on the forefoot, but could feel coolness an pressure.   Proprioception tested in the upper extremities was normal.   Coordination: Rapid alternating movements in the fingers/hands were of normal speed.  The Finger-to-nose maneuver was intact without evidence of ataxia, dysmetria or tremor.  Gait and station: Patient could rise unassisted from a seated position, walked without assistive device.  Stance is of normal width/ base .Marland Kitchen  Toe and heel walk were deferred.  Deep tendon reflexes: in the  upper and lower extremities are symmetric and intact.  Babinski response was deferred normal        After spending a total  time of 35 minutes face to face and additional time for physical and neurologic examination, review of laboratory studies,  personal review of imaging studies, reports and results of other testing and review of referral information / records as far as  provided in visit, I have established the following assessments:  1)  Given the documented hypoxemia I suspect that the patient will have a REM dependent sleep apnea.  The longest periods of oxygen desaturation all of her in the early morning hours and are not related to her usual bathroom break time.  Follow while there is some motion artifact around 430 there are prolonged hypoxemia.  Already at 230 and also is late at 6 AM.  And this has been repeatedly demonstrated throughout the night.  Her pulse range does not respond as nimble to these oxygen desaturations.  She has been taking metoprolol as needed and not on a regular basis and this could affect the response of this of her heart rate.  The ring will not allow me to see if she has arrhythmia only a heart rate variability can be demonstrated and her pulse rate at the highest was only 78 bpm.  The lowest oxygen saturation at nadir 77%.  I agree with Dr. Zadie Rhine that an evaluation and treatment for sleep apnea should be performed given that this may reverse some of the macular edema especially in the left eye.    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      My Plan is to proceed with:  1) attended sleep study with female attendant.or HST. SPLIT protocol.   2) she would prefer the HST, but we want to see REM sleep dependence, hypoxia, arrhythmia.    I would like to thank Deloria Lair, MD  for allowing me to meet with and to take care of this pleasant patient.   In short, Kelly Glover is presenting with possible OSA, high risk patient.   I plan to follow up either personally within 3 month.   CC: I will share my notes with Dr Forde Dandy, MD   Electronically signed  by: Larey Seat, MD 11/07/2020 3:12 PM  [facility] and [facility] Sleep Board certified by The [employer] of Sleep Medicine and Diplomate of the [employer] of Sleep Medicine. Board certified In Neurology through the [place], Fellow of the [employer] of Neurology. Medical Director of [employer].

## 2020-11-07 NOTE — Progress Notes (Deleted)
Subjective:    Patient ID: Kelly Glover is a 70 y.o. female.  HPI {Common ambulatory SmartLinks:19316}  Review of Systems  Objective:  Neurological Exam  Physical Exam  Assessment:   ***  Plan:   ***

## 2020-11-19 ENCOUNTER — Telehealth: Payer: Self-pay | Admitting: Neurology

## 2020-11-19 DIAGNOSIS — E119 Type 2 diabetes mellitus without complications: Secondary | ICD-10-CM | POA: Diagnosis not present

## 2020-11-19 NOTE — Telephone Encounter (Signed)
Pt is scheduled for this Thursday night, 11/21/20 at 8pm.

## 2020-11-19 NOTE — Telephone Encounter (Signed)
PT requesting call back to schedule HST

## 2020-11-21 DIAGNOSIS — Z794 Long term (current) use of insulin: Secondary | ICD-10-CM | POA: Diagnosis not present

## 2020-11-21 DIAGNOSIS — E10319 Type 1 diabetes mellitus with unspecified diabetic retinopathy without macular edema: Secondary | ICD-10-CM | POA: Diagnosis not present

## 2020-11-21 DIAGNOSIS — Z4681 Encounter for fitting and adjustment of insulin pump: Secondary | ICD-10-CM | POA: Diagnosis not present

## 2020-11-21 DIAGNOSIS — E785 Hyperlipidemia, unspecified: Secondary | ICD-10-CM | POA: Diagnosis not present

## 2020-11-21 DIAGNOSIS — K760 Fatty (change of) liver, not elsewhere classified: Secondary | ICD-10-CM | POA: Diagnosis not present

## 2020-11-21 DIAGNOSIS — G479 Sleep disorder, unspecified: Secondary | ICD-10-CM | POA: Diagnosis not present

## 2020-11-21 DIAGNOSIS — I10 Essential (primary) hypertension: Secondary | ICD-10-CM | POA: Diagnosis not present

## 2020-11-21 DIAGNOSIS — E668 Other obesity: Secondary | ICD-10-CM | POA: Diagnosis not present

## 2020-11-21 DIAGNOSIS — H3093 Unspecified chorioretinal inflammation, bilateral: Secondary | ICD-10-CM | POA: Diagnosis not present

## 2020-11-21 DIAGNOSIS — I700 Atherosclerosis of aorta: Secondary | ICD-10-CM | POA: Diagnosis not present

## 2020-11-21 DIAGNOSIS — E113312 Type 2 diabetes mellitus with moderate nonproliferative diabetic retinopathy with macular edema, left eye: Secondary | ICD-10-CM | POA: Diagnosis not present

## 2020-11-21 DIAGNOSIS — M8580 Other specified disorders of bone density and structure, unspecified site: Secondary | ICD-10-CM | POA: Diagnosis not present

## 2020-11-25 ENCOUNTER — Ambulatory Visit (INDEPENDENT_AMBULATORY_CARE_PROVIDER_SITE_OTHER): Payer: Medicare Other | Admitting: Neurology

## 2020-11-25 ENCOUNTER — Other Ambulatory Visit: Payer: Self-pay

## 2020-11-25 DIAGNOSIS — G4733 Obstructive sleep apnea (adult) (pediatric): Secondary | ICD-10-CM

## 2020-11-25 DIAGNOSIS — E113312 Type 2 diabetes mellitus with moderate nonproliferative diabetic retinopathy with macular edema, left eye: Secondary | ICD-10-CM

## 2020-11-25 DIAGNOSIS — G4734 Idiopathic sleep related nonobstructive alveolar hypoventilation: Secondary | ICD-10-CM

## 2020-11-25 DIAGNOSIS — Z9641 Presence of insulin pump (external) (internal): Secondary | ICD-10-CM

## 2020-11-25 DIAGNOSIS — Z9189 Other specified personal risk factors, not elsewhere classified: Secondary | ICD-10-CM

## 2020-12-04 ENCOUNTER — Telehealth: Payer: Self-pay | Admitting: Neurology

## 2020-12-04 NOTE — Telephone Encounter (Signed)
The results were posted in EPIC now- please let Kelly Glover know that we will ask her to come back for PAP and possible oxygen titration.

## 2020-12-04 NOTE — Telephone Encounter (Signed)
Pt completed the SS on 11/25/20. It can be a 10-14 business day turn around before receiving results. I will let Dr Brett Fairy know the patient is calling and as soon as I have the results I will contact the patient.

## 2020-12-04 NOTE — Addendum Note (Signed)
Addended by: Larey Seat on: 12/04/2020 01:31 PM   Modules accepted: Orders

## 2020-12-04 NOTE — Telephone Encounter (Signed)
Pt. is wanting to know sleep study results. Please advise.

## 2020-12-04 NOTE — Progress Notes (Signed)
Cc Dr Zadie Rhine as referring [id number] were no bathroom breaks. Moderate Snoring was noted. EKG was in keeping with normal sinus rhythm (NSR).    IMPRESSION:  1. Mild - moderate OSA was noted to be associated with severe hypoxemia - the low oxygen saturation had a nadir at 69% and lasted for a total of 53 minutes. This complicated the otherwise mild -moderate REM sleep dependent Obstructive Sleep Apnea(OSA). 2. Snoring.  RECOMMENDATIONS:  1. Advise to avoid supine sleep and to follow up with a full night, attended CPAP titration study to optimize therapy. The patient is at risk of not responding to CPAP alone and may need additional oxygen supplementation. The hypoxia and OSA can contribute to retinal- vascular changes, edema.

## 2020-12-04 NOTE — Procedures (Signed)
PATIENT'S NAME:  Kelly, Glover DOB:      [birth year]      MR#:    [id number]     DATE OF RECORDING: 11/25/2020 Kelly Glover,  REFERRING M.D.:  Deloria Lair, MD Study Performed:   Baseline Polysomnogram HISTORY:  Kelly Glover is a 70 - year- old Caucasian female patient was seen here upon referral on 11/07/2020 from Dr Zadie Rhine, concerned about hypoxemia at night, retinal changes, and cystoid macular edema. an overnight pulseoximetry had indicated abnormalities.  Pt reports she is here to discuss sleep study per Dr. Dahlia Bailiff recommendation- has had insulin dependent diabetes, now for 15 years on Insulin pump. She reports she does snore at night.   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 She used to be obese.   The patient endorsed the Epworth Sleepiness Scale at 2 points.   The patient's weight 175 pounds with a height of 66 (inches), resulting in a BMI of 28. kg/m2. The patient's neck circumference measured 16 inches.  CURRENT MEDICATIONS: Benadryl this night- Aspirin, Ayrtec, Vitamin D, Jardiance, Estrace, Zetia, Pepcid AC, Folic Acid, Advil PM, Novolog injection, Zegerid, Zofran ODT, Crestor, Altace, Zoloft   PROCEDURE:  This is a multichannel digital polysomnogram utilizing the Somnostar 11.2 system.  Electrodes and sensors were applied and monitored per AASM Specifications.   EEG, EOG, Chin and Limb EMG, were sampled at 200 Hz.  ECG, Snore and Nasal Pressure, Thermal Airflow, Respiratory Effort, CPAP Flow and Pressure, Oximetry was sampled at 50 Hz. Digital video and audio were recorded.      BASELINE STUDY: Lights Out was at 21:41 and Lights On at 05:06.  Total recording time (TRT) was 446 minutes, with a total sleep time (TST) of 401.5 minutes.   The patient's sleep latency was 16.5 minutes.   REM latency was 225 minutes.  The sleep efficiency was 90.2 %.     SLEEP ARCHITECTURE: WASO (Wake after sleep onset) was 28 minutes. There were 30 minutes in Stage N1, 318 minutes Stage N2, 0 minutes Stage N3 and 53.5 minutes in Stage REM.  The percentage of Stage N1 was 7.5%, Stage N2 was 79.2%, Stage N3 was 0% and Stage R (REM sleep) was 13.3%.   RESPIRATORY ANALYSIS:  There were a total of 106 respiratory events:  5 obstructive apneas, 0 central apneas and 0 mixed apneas with a total of 5 apneas and an apnea index (AI) of .7 /hour. There were 101 hypopneas with a hypopnea index of 15.1 /hour.    The total APNEA/HYPOPNEA INDEX (AHI) was 15.8/hour.  30 events occurred in REM sleep and 142 events in NREM. The REM AHI was  33.6 /hour, versus a non-REM AHI of 13.1. The patient spent 143.5 minutes of total sleep time in the supine position and 258 minutes in non-supine. The supine AHI was 25.9 versus a non-supine AHI of 10.3.  OXYGEN SATURATION & C02:  The Wake baseline 02 saturation was 92%, with the lowest being 69%. Time spent below 89% saturation equaled 53 minutes. The arousals were noted as: 57 were spontaneous, 0 were associated with PLMs, 29 were associated with respiratory events. The patient had a total of 0 Periodic Limb Movements Audio and video analysis did not show any abnormal or unusual movements, behaviors, phonations or vocalizations.  There were no bathroom breaks.  Moderate Snoring was noted. EKG was in keeping with normal sinus rhythm (NSR).    IMPRESSION:  1. Mild - moderate OSA with severe hypoxemia - the low oxygen saturation had a nadir at 69% and lasted for a total of 53 minutes. This complicated the otherwise mild -moderate REM sleep dependent Obstructive Sleep Apnea(OSA). 2. Snoring.  RECOMMENDATIONS:  1. Advise to avoid supine sleep and to follow up with a full night, attended CPAP titration study to optimize therapy. The patient is at risk of not responding to CPAP  alone and may need additional oxygen supplementation. The hypoxia and OSA can contribute to retinal- vascular changes, edema.     I certify that I have reviewed the entire raw data recording prior to the issuance of this report in accordance with the Standards of Accreditation of the American Academy of Sleep Medicine (AASM)    Larey Seat, MD Diplomat, American Board of Psychiatry and Neurology  Diplomat, American Board of Sleep Medicine Market researcher, [place] [facility] at [employer]

## 2020-12-04 NOTE — Telephone Encounter (Signed)
I called pt. I advised pt that Dr. Brett Fairy reviewed their sleep study results and found that has mild to moderate OSA with hypoxemia and recommends that pt be treated with a cpap. Dr. Brett Fairy recommends that pt return for a repeat sleep study in order to properly titrate the cpap and ensure a good mask fit. Pt is agreeable to returning for a titration study. I advised pt that our sleep lab will file with pt's insurance and call pt to schedule the sleep study when we hear back from the pt's insurance regarding coverage of this sleep study. Pt verbalized understanding of results. Pt had no questions at this time but was encouraged to call back if questions arise.      IMPRESSION:   1. Mild - moderate OSA was noted to be associated with severe hypoxemia - the low oxygen saturation had a nadir at 69% and lasted for a total of 53 minutes. This complicated the otherwise mild -moderate REM sleep dependent Obstructive Sleep Apnea(OSA).  2. Snoring.   RECOMMENDATIONS:   1. Advise to avoid supine sleep and to follow up with a full night, attended CPAP titration study to optimize therapy. The patient is at risk of not responding to CPAP alone and may need additional oxygen supplementation. The hypoxia and OSA can contribute to retinal- vascular changes, edema.

## 2020-12-17 ENCOUNTER — Encounter (INDEPENDENT_AMBULATORY_CARE_PROVIDER_SITE_OTHER): Payer: Medicare Other | Admitting: Ophthalmology

## 2020-12-23 ENCOUNTER — Telehealth: Payer: Self-pay

## 2020-12-23 NOTE — Telephone Encounter (Signed)
Patient called and wanted to know if she can come fro her sleep study tomorrow night with a cold. I asked if she had been tested for covid and she had not. Her entire family has had this cold and they all tested negative. I asked if she would get tested and bring the results and she will be fine to proceed with study. She was ok with this and will follow thru.

## 2020-12-24 ENCOUNTER — Ambulatory Visit (INDEPENDENT_AMBULATORY_CARE_PROVIDER_SITE_OTHER): Payer: Medicare Other | Admitting: Neurology

## 2020-12-24 DIAGNOSIS — G4734 Idiopathic sleep related nonobstructive alveolar hypoventilation: Secondary | ICD-10-CM

## 2020-12-24 DIAGNOSIS — E113312 Type 2 diabetes mellitus with moderate nonproliferative diabetic retinopathy with macular edema, left eye: Secondary | ICD-10-CM

## 2020-12-24 DIAGNOSIS — G4733 Obstructive sleep apnea (adult) (pediatric): Secondary | ICD-10-CM | POA: Diagnosis not present

## 2020-12-24 DIAGNOSIS — Z9641 Presence of insulin pump (external) (internal): Secondary | ICD-10-CM

## 2020-12-24 DIAGNOSIS — Z9189 Other specified personal risk factors, not elsewhere classified: Secondary | ICD-10-CM

## 2020-12-25 ENCOUNTER — Other Ambulatory Visit: Payer: Self-pay

## 2020-12-31 NOTE — Progress Notes (Signed)
DIAGNOSIS 1. Obstructive Sleep Apnea and sleep Hypoxia responded very well to 9 cm water CPAP, under use of a N 20 nasal mask by [employer]Med in medium size. Excellent sleep duration and high REM sleep proportion were noted.   DISCUSSION: I will order an autotitration device CPAP with heated humidification and settings from 5 through 12 cm water 2 cm EPR and N 20 nasal mask by [employer] in medium size.   A follow up appointment will be scheduled in the Sleep Clinic at [facility].   Please call [phone] with any questions.

## 2020-12-31 NOTE — Procedures (Signed)
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 An overnight pulsoximetry had indicated abnormalities. She returned today following a PSG at [facility] sleep from 11-25-2020, which found mild to moderate OSA at AHI of 15.8/h but sever Hypoxemia, nadir at 69% and  total desaturation time of 53 minutes.  The total APNEA/HYPOPNEA INDEX (AHI) was 15.8/hour. The REM AHI was 33.6 /hour, versus a non-REM AHI of 13.1/h. The supine AHI was 25.9/h versus a non-supine AHI of 10.3/h.  The patient endorsed the Epworth Sleepiness Scale at 2 points.   The patient's weight 175 pounds with a height of 66 (inches), resulting in a BMI of 28. kg/m2. The patient's neck circumference measured 16 inches.  CURRENT MEDICATIONS: Aspirin, Ayrtec, Vitamin D, Jardiance, Estrace, Zetia, Pepcid AC, Folic Acid, Advil PM, Novolog injection, Zegerid, Zofran ODT, Crestor, Altace, Zoloft   PROCEDURE:  This is a multichannel digital polysomnogram utilizing the SomnoStar 11.2 system.  Electrodes and sensors were applied and monitored per AASM Specifications.   EEG, EOG, Chin and Limb EMG, were sampled at 200 Hz.  ECG, Snore and Nasal Pressure, Thermal Airflow, Respiratory Effort, CPAP Flow and Pressure, Oximetry was sampled at 50 Hz. Digital video and audio were recorded.       CPAP was initiated under a ResMed P 10 nasal pillow, then switched to a nasal ResMed N 20 in medium size- at 5 cmH20 with heated humidity per AASM standards and pressure was advanced to 9/cmH20 because of hypopneas, apneas and desaturations.  At a PAP pressure of 9 cmH20, there was a  reduction of the AHI to 0.8/h with improvement of sleep apnea over a 290 minute sleep time. The oxygen saturation improved under 8 and 9 cm water pressure.   Lights Out was at 22:28 and Lights On at 06:11. Total recording time (TRT) was 463.5 minutes, with a total sleep time (TST) of 428.5 minutes. The patient's sleep latency was 17 minutes. REM latency was 93 minutes.  The sleep efficiency was 92.4 %.    SLEEP ARCHITECTURE: WASO (Wake after sleep onset) was 17.5 minutes.  There were 33 minutes in Stage N1, 190.5 minutes Stage N2, 62 minutes Stage N3 and 143 minutes in Stage REM.  The percentage of Stage N1 was 7.7%, Stage N2 was 44.5%, Stage N3 was 14.5% and Stage R (REM sleep) was 33.4%. The sleep architecture was notable for long REM sleep periods.      RESPIRATORY ANALYSIS:  There was a total of 24 respiratory events: 0 obstructive apneas, 0 central apneas and 0 mixed apneas with a total of 0 apneas and an apnea index (AHI) of 0 /hour. There were 24 hypopneas with a hypopnea index of 3.4/hour. The patient also had 0 respiratory event related arousals (RERAs).      The total APNEA/HYPOPNEA INDEX  (AHI) was 3.4 /hour .  16 events occurred in REM sleep and 8 events in NREM. The REM AHI was 6.7 /hour versus a non-REM AHI of 1.7 /hour.  The patient spent 232 minutes of total sleep time in the  supine position and 197 minutes in non-supine. The supine AHI was 5.4/h, versus a non-supine AHI of 0.9.  OXYGEN SATURATION & C02:  The baseline 02 saturation was 96%, with the lowest being 74%. Time spent below 89% saturation equaled 9 minutes. The arousals were noted as: 23 were spontaneous, 0 were associated with PLMs, 0 were associated with respiratory events. The patient had a total of 0 Periodic Limb Movements.  Audio and video analysis did not show any abnormal or unusual movements, behaviors, phonations or vocalizations.  The patient required no bathroom breaks. Snoring was alleviated. EKG was in keeping  with normal sinus rhythm.  DIAGNOSIS 1. Obstructive Sleep Apnea and sleep Hypoxia responded very well to 9 cm water CPAP, under use of a N 20 nasal mask by ResMed in medium size. Excellent sleep duration and high REM sleep proportion were noted.   DISCUSSION: I will order an autotitration device CPAP with heated humidification and settings from 5 through 12 cm water 2 cm EPR and N 20 nasal mask by ResMed in medium size.   A follow up appointment will be scheduled in the Sleep Clinic at [facility].   Please call [phone] with any questions.      I certify that I have reviewed the entire raw data recording prior to the issuance of this report in accordance with the Standards of Accreditation of the American Academy of Sleep Medicine (AASM)    Larey Seat, M.D. Diplomat, Tax adviser of Psychiatry and Neurology  Diplomat, Tax adviser of Sleep Medicine Market researcher, [employer] [facility] at [employer]

## 2020-12-31 NOTE — Addendum Note (Signed)
Addended by: Larey Seat on: 12/31/2020 07:04 PM   Modules accepted: Orders

## 2021-01-01 ENCOUNTER — Telehealth: Payer: Self-pay | Admitting: Neurology

## 2021-01-01 ENCOUNTER — Encounter: Payer: Self-pay | Admitting: Neurology

## 2021-01-01 NOTE — Telephone Encounter (Signed)
-----   Message from Larey Seat, MD sent at 12/31/2020  7:04 PM EDT ----- DIAGNOSIS 1. Obstructive Sleep Apnea and sleep Hypoxia responded very well to 9 cm water CPAP, under use of a N 20 nasal mask by ResMed in medium size. Excellent sleep duration and high REM sleep proportion were noted.   DISCUSSION: I will order an autotitration device CPAP with heated humidification and settings from 5 through 12 cm water 2 cm EPR and N 20 nasal mask by ResMed in medium size.   A follow up appointment will be scheduled in the Sleep Clinic at [facility].   Please call [phone] with any questions.

## 2021-01-01 NOTE — Telephone Encounter (Signed)
I called pt. I advised pt that Dr. Brett Fairy reviewed their sleep study results and found that pt was best treated under CPAP at 9 cm. Dr. Brett Fairy recommends that pt starts auto CPAP 5-12 cm water pressure. I reviewed PAP compliance expectations with the pt. Pt is agreeable to starting a CPAP. I advised pt that an order will be sent to a DME, [facility] and [facility] will call the pt within about one week after they file with the pt's insurance. [facility]) will show the pt how to use the machine, fit for masks, and troubleshoot the CPAP if needed. A follow up appt will need to be made for insurance purposes with Dr. Brett Fairy or the NP within 31-90 days from the date she picks up the machine. Pt verbalized understanding to call and schedule this apt. A letter with all of this information in it will be mailed to the pt as a reminder. I verified with the pt that the address we have on file is correct. Pt verbalized understanding of results. Pt had no questions at this time but was encouraged to call back if questions arise. I have sent the order to [facility]) and have received confirmation that they have received the order.

## 2021-02-18 ENCOUNTER — Telehealth: Payer: Self-pay | Admitting: Neurology

## 2021-02-18 DIAGNOSIS — I10 Essential (primary) hypertension: Secondary | ICD-10-CM | POA: Diagnosis not present

## 2021-02-18 DIAGNOSIS — Z4681 Encounter for fitting and adjustment of insulin pump: Secondary | ICD-10-CM | POA: Diagnosis not present

## 2021-02-18 DIAGNOSIS — E10319 Type 1 diabetes mellitus with unspecified diabetic retinopathy without macular edema: Secondary | ICD-10-CM | POA: Diagnosis not present

## 2021-02-18 DIAGNOSIS — Z794 Long term (current) use of insulin: Secondary | ICD-10-CM | POA: Diagnosis not present

## 2021-02-18 DIAGNOSIS — E785 Hyperlipidemia, unspecified: Secondary | ICD-10-CM | POA: Diagnosis not present

## 2021-02-18 NOTE — Telephone Encounter (Signed)
Called pt and scheduled initial CPAP f/u for 04/23/21 at 8:30 am. Pt would like to speak to nurse regarding questions about machine. She states that we informed her she would require oxygen but that [facility] told her there was nothing written stating this. Please advise.

## 2021-02-18 NOTE — Telephone Encounter (Signed)
Called the patient back.  March sleep study that was repleted in the lab once the patient was under a CPAP pressure of 8 and 9 cm water pressure, her oxygen level was improved.  Based off of that study there was no indication to add oxygen.  Patient was appreciative for the call and clarification.

## 2021-04-22 ENCOUNTER — Encounter (INDEPENDENT_AMBULATORY_CARE_PROVIDER_SITE_OTHER): Payer: Self-pay

## 2021-04-23 ENCOUNTER — Encounter: Payer: Self-pay | Admitting: Neurology

## 2021-04-23 ENCOUNTER — Ambulatory Visit (INDEPENDENT_AMBULATORY_CARE_PROVIDER_SITE_OTHER): Payer: Medicare Other | Admitting: Neurology

## 2021-04-23 VITALS — BP 138/75 | HR 64 | Ht 66.0 in | Wt 175.0 lb

## 2021-04-23 DIAGNOSIS — E113312 Type 2 diabetes mellitus with moderate nonproliferative diabetic retinopathy with macular edema, left eye: Secondary | ICD-10-CM

## 2021-04-23 DIAGNOSIS — G4733 Obstructive sleep apnea (adult) (pediatric): Secondary | ICD-10-CM | POA: Diagnosis not present

## 2021-04-23 DIAGNOSIS — G4734 Idiopathic sleep related nonobstructive alveolar hypoventilation: Secondary | ICD-10-CM

## 2021-04-23 DIAGNOSIS — Z9641 Presence of insulin pump (external) (internal): Secondary | ICD-10-CM | POA: Diagnosis not present

## 2021-04-23 NOTE — Progress Notes (Signed)
SLEEP MEDICINE CLINIC    Provider:  Larey Seat, MD  Primary Care Physician:  [address], [place] [address]     Referring Provider: Dr Deloria Lair, MD         Chief Complaint according to patient   Patient presents with:     New Patient (Initial Visit)           HISTORY OF PRESENT ILLNESS:  Kelly Glover is a 70 y.o.  Caucasian female patient seen here in a RV on 04/23/2021 : Kelly Glover is here today for a follow-up she underwent a split-night polysomnography study on December 04, 2020 which revealed apnea to be present this was an mild apnea category with an AHI of 15.8 and this to have much higher REM sleep AHI than non-REM sleep AHI.  Supine sleep also accentuated the apnea hypopnea index 25.9/h versus a nonsupine apnea-hypopnea index of 10.3.  There were prolonged periods of hypoxemia hypoxia at nadir was 69% saturation.  So the treatment for this constellation is usually a positive airway pressure therapy the patient started on CPAP. Dr Zadie Rhine, ophthalmologist-the mean pressure usage is 7.1 cmH2O the average 95th percentile pressure is 9.1 cmH2O these are still mild pressures required to overcome this patient's apnea.  She has very minimal air leakage on average 2 minutes she has an average leak of 7.5 L/min that is mild as well and her residual apnea hypopnea index is now 2.4 which is a good resolution of sleep apnea.  She has used the machine 30 out of 30 days the total usage and date range was 5 hours 32 minutes on average this fulfills the criteria of compliance by her insurance company.  Ramp pressure is 4 cmH2O minimum pressure maintained is 5 and maximum pressure is 12 cmH2O.  She has access to oximetry and this documented resolution of hypoxemia. This will please dr Zadie Rhine as well  Epworth score: 2/24  no more morning headache !  ROS: 30 sec drop attack, looking pale. Had 2 of these- not sure of cause.         from Dr Zadie Rhine, concerned  about hypoxemia at night , measured by ring, and cystoid macular edema. .  Chief concern according to patient :  Rm 11 with husband, Kelly Glover.  Pt reports she is here to discuss sleep study per Dr. 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 She denies prior sleep study and reports she does snore at night.    I have the pleasure of seeing Kelly Glover today, a right -handed Caucasian female with a possible sleep disorder.  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     Sleep relevant medical history: Nocturia once at 4.30 AM. Family medical /sleep history: 2 sisters  suspected to have OSA, no insomnia, no sleep walkers.    Social history:  adult children , some out of state. Husband is MD> Patient is retired, homemaker, and lives in a household with 2 persons, 3 grandchildren visit often.  Tobacco use; never.  ETOH use ; none ,  Caffeine intake in form of Coffee( all day, 2 cups  In AM one at lunch).  Sleep habits are as follows: The patient's dinner time is between 5-7 PM. The patient goes to bed at 10.30 PM and continues to sleep for 5 hours, wakes for one bathroom breaks.  She likes the bedroom cool, quiet and dark.   The preferred sleep position is side , with the support of 2 pillows.  Dreams are reportedly infrequent/vivid.  7 AM is the usual rise time. The patient wakes up spontaneously.   She reports  feeling refreshed and restored in AM, lately she had headaches in AM with symptoms such as dry mouth, morning headaches. No Naps are taken.Given the documented hypoxemia I suspect that the patient will have a REM dependent sleep apnea.  The longest periods of  oxygen desaturation all of her in the early morning hours and are not related to her usual bathroom break time.  Follow while there is some motion artifact around 430 there are prolonged hypoxemia.  Already at 230 and also is late at 6 AM.  And this has been repeatedly demonstrated throughout the night.  Her pulse range does not respond as nimble to these oxygen desaturations.  She has been taking metoprolol as needed and not on a regular basis and this could affect the response of this of her heart rate.  The ring will not allow me to see if she has arrhythmia only a heart rate variability can be demonstrated and her pulse rate at the highest was only 78 bpm.  The lowest oxygen saturation at nadir 77%.  I agree with Dr. Zadie Rhine that an evaluation and treatment for sleep apnea should be performed given that this may reverse some of the macular edema especially in the left eye.     Review of Systems: Out of a complete 14 system review, the patient complains of only the following symptoms, and all other reviewed systems are negative.:   loud snoring,   How likely are you to doze in the following situations: 0 = not likely, 1 = slight chance, 2 = moderate chance, 3 = high chance   Sitting and Reading? Watching Television? Sitting inactive in a public place (theater or meeting)? As a passenger in a car for an hour without a break? Lying down in the afternoon when circumstances permit? Sitting and talking to someone? Sitting quietly after lunch without alcohol? In a car, while stopped for a few minutes in traffic?   Total = 2/ 24 points - unchanged   FSS endorsed at 12/ 63 points.   Social History   Socioeconomic History   Marital status: Married    Spouse name: Not on file   Number of children: Not on file   Years of education: Not on file   Highest education level: Not on file  Occupational History   Not on file  Tobacco Use   Smoking status: Never   Smokeless tobacco: Never  Substance  and Sexual Activity   Alcohol use: No    Alcohol/week: 0.0 standard drinks   Drug use: No   Sexual activity: Yes    Partners: Male    Birth control/protection: Surgical    Comment: hysterectomy  Other Topics Concern   Not on file  Social History Narrative   Not on file   Social Determinants of Health   Financial Resource Strain: Not on file  Food Insecurity: Not on file  Transportation Needs: Not on file  Physical Activity: Not on file  Stress: Not on file  Social Connections: Not on file  Family History  Problem Relation Age of Onset   Diabetes Mother    Heart disease Mother    Heart disease Father    Thyroid disease Sister    Asthma Maternal Grandmother 27   Diabetes Maternal Grandmother    Breast cancer Maternal Grandmother    Diabetes Maternal Grandfather    Graves' disease Son    Thyroid disease Son     Past Medical History:  Diagnosis Date   Diabetes ([place])    History of diverticulitis    History of kidney stones    Mast cell disorder    inappropriate mast cell activation   Neuromuscular disorder ([place])    inappropriate mast cell response disorder    PONV (postoperative nausea and vomiting)    Squamous cell carcinoma of left lower leg 2015    Past Surgical History:  Procedure Laterality Date   BREAST BIOPSY Left ?   benign   DIAGNOSTIC LAPAROSCOPY     RECTOCELE REPAIR N/A 09/17/2014   Procedure: POSTERIOR REPAIR , enterocele repair;  Surgeon: Jamey Reas de Berton Lan, MD;  Location: [facility];  Service: Gynecology;  Laterality: N/A;   TONSILLECTOMY  childhood   TOTAL ABDOMINAL HYSTERECTOMY W/ BILATERAL SALPINGOOPHORECTOMY  05/20/98     Current Outpatient Medications on File Prior to Visit  Medication Sig Dispense Refill   aspirin EC 81 MG tablet Take 81 mg by mouth daily.     cetirizine (ZYRTEC) 10 MG tablet Take 10 mg by mouth daily.     Cholecalciferol (VITAMIN D) 2000 UNITS CAPS Take 1 capsule by mouth daily.     estradiol (ESTRACE) 2  MG tablet   0   ezetimibe (ZETIA) 10 MG tablet Take 10 mg by mouth daily.  0   famotidine (PEPCID AC) 10 MG chewable tablet Chew 10 mg by mouth daily.      FOLIC ACID PO Take 466 mcg by mouth daily.      Ibuprofen-Diphenhydramine Cit (ADVIL PM PO) Take 0.5 tablets by mouth daily as needed (sleep).     JARDIANCE 25 MG TABS tablet Take 25 mg by mouth daily.     NOVOLOG 100 UNIT/ML injection Inject into the skin as directed. Insulin pump     omeprazole-sodium bicarbonate (ZEGERID) 40-1100 MG per capsule Take 1 capsule by mouth daily before breakfast.     ondansetron (ZOFRAN ODT) 4 MG disintegrating tablet Take 1 tablet (4 mg total) by mouth every 8 (eight) hours as needed for nausea. 6 tablet 0   ONE TOUCH ULTRA TEST test strip      ramipril (ALTACE) 2.5 MG capsule Take 1 capsule by mouth daily.     rosuvastatin (CRESTOR) 40 MG tablet Take 40 mg by mouth daily.      sertraline (ZOLOFT) 100 MG tablet Take 50 mg by mouth daily.      No current facility-administered medications on file prior to visit.    Allergies  Allergen Reactions   Diflucan [Fluconazole]     Itching, palms red, throat itchy.    Scallops [Shellfish Allergy] Itching    Redness, throat constriction   Percocet [Oxycodone-Acetaminophen] Other (See Comments)    Diaphoresis, nausea, confusion.    Doxycycline Itching and Rash    Itchy throat   Griseofulvin Rash    Physical exam:  Today's Vitals   04/23/21 0818  BP: 138/75  Pulse: 64  Weight: 175 lb (79.4 kg)  Height: 5' 6"  (1.676 m)   Body mass index is 28.25 kg/m.  Wt Readings from Last 3 Encounters:  04/23/21 175 lb (79.4 kg)  11/07/20 175 lb (79.4 kg)  06/26/17 165 lb (74.8 kg)     Ht Readings from Last 3 Encounters:  04/23/21 5' 6"  (1.676 m)  11/07/20 5' 6"  (1.676 m)  06/26/17 5' 6"  (1.676 m)      General: The patient is awake, alert and appears not in acute distress. The patient is well groomed. Head: Normocephalic, atraumatic. Neck is supple.  Mallampati 1-2 neck circumference: 16 inches . Nasal airflow patent.  Retrognathia is not seen.  Dental status:  Cardiovascular:  Regular rate and cardiac rhythm by pulse,  without distended neck veins. Respiratory: Lungs are clear to auscultation.  Skin:  Without evidence of ankle edema, or rash. Trunk: The patient's posture is erect.   Neurologic exam : The patient is awake and alert, oriented to place and time.   Memory subjective described as intact.  Attention span & concentration ability appears normal.  Speech is fluent,  without  dysarthria, dysphonia or aphasia.  Mood and affect are appropriate.   Cranial nerves: no loss of smell or taste reported  Pupils are equal and briskly reactive to light. Funduscopic exam deferred. .  Extraocular movements in vertical and horizontal planes were intact and without nystagmus.  No Diplopia. Visual fields by finger perimetry are intact. Hearing was intact to soft voice and finger rubbing.    Facial sensation intact to fine touch.  Facial motor strength is symmetric and tongue and uvula move midline.  Neck ROM : rotation, tilt and flexion extension were normal for age and shoulder shrug was symmetrical.    Motor exam:  Symmetric bulk, tone and ROM.   Normal tone without cog -wheeling, symmetric grip strength .   Sensory:  temperature, pressure and vibration were tested - she felt no vibration on the forefoot, but could feel coolness an pressure.   Proprioception tested in the upper extremities was normal.   Coordination: Rapid alternating movements in the fingers/hands were of normal speed.  The Finger-to-nose maneuver was intact without evidence of ataxia, dysmetria or tremor.  Gait and station: Patient could rise unassisted from a seated position, walked without assistive device.  Stance is of normal width/ base .Marland Kitchen  Toe and heel walk were deferred.  Deep tendon reflexes: in the  upper and lower extremities are symmetric and intact.   Babinski response was deferred normal        After spending a total time of 20 minutes face to face and additional time for physical and neurologic examination, review of laboratory studies,  personal review of imaging studies, reports and results of other testing and review of referral information / records as far as provided in visit, I have established the following assessments:  1)  OSA with hypoxia and REM dependence, sleeping on her side- doing well on CPAP with good resolution of OSA and HYPOXIA.   My Plan is to proceed with:  2) continuously using CPAP- awaiting Dr Dahlia Bailiff report on retinal health.    I would like to thank Deloria Lair, MD  for allowing me to meet with and to take care of this pleasant patient.    I plan to follow up either personally within 6 month.   CC: I will share my notes with Dr Forde Dandy, MD   Electronically signed by: Larey Seat, MD 04/23/2021 8:41 AM  [facility] Board certified by The [employer] of Sleep Medicine and Diplomate of  the American Academy of Sleep Medicine. Board certified In Neurology through the [employer], Fellow of the [employer] of Neurology. Medical Director of [facility].

## 2021-04-23 NOTE — Patient Instructions (Signed)
Sleep Apnea Sleep apnea is a condition in which breathing pauses or becomes shallow during sleep. People with sleep apnea usually snore loudly. They may have times when they gasp and stop breathing for 10 seconds or more during sleep. This mayhappen many times during the night. Sleep apnea disrupts your sleep and keeps your body from getting the rest that it needs. This condition can increase your risk of certain health problems, including: Heart attack. Stroke. Obesity. Type 2 diabetes. Heart failure. Irregular heartbeat. High blood pressure. The goal of treatment is to help you breathe normally again. What are the causes? The most common cause of sleep apnea is a collapsed or blocked airway. There are three kinds of sleep apnea: Obstructive sleep apnea. This kind is caused by a blocked or collapsed airway. Central sleep apnea. This kind happens when the part of the brain that controls breathing does not send the correct signals to the muscles that control breathing. Mixed sleep apnea. This is a combination of obstructive and central sleep apnea. What increases the risk? You are more likely to develop this condition if you: Are overweight. Smoke. Have a smaller than normal airway. Are older. Are female. Drink alcohol. Take sedatives or tranquilizers. Have a family history of sleep apnea. Have a tongue or tonsils that are larger than normal. What are the signs or symptoms? Symptoms of this condition include: Trouble staying asleep. Loud snoring. Morning headaches. Waking up gasping. Dry mouth or sore throat in the morning. Daytime sleepiness and tiredness. If you have daytime fatigue because of sleep apnea, you may be more likely to have: Trouble concentrating. Forgetfulness. Irritability or mood swings. Personality changes. Feelings of depression. Sexual dysfunction. This may include loss of interest if you are female, or erectile dysfunction if you are female. How is this  diagnosed? This condition may be diagnosed with: A medical history. A physical exam. A series of tests that are done while you are sleeping (sleep study). These tests are usually done in a sleep lab, but they may also be done at home. How is this treated? Treatment for this condition aims to restore normal breathing and to ease symptoms during sleep. It may involve managing health issues that can affect breathing, such as high blood pressure or obesity. Treatment may include: Sleeping on your side. Using a decongestant if you have nasal congestion. Avoiding the use of depressants, including alcohol, sedatives, and narcotics. Losing weight if you are overweight. Making changes to your diet. Quitting smoking. Using a device to open your airway while you sleep, such as: An oral appliance. This is a custom-made mouthpiece that shifts your lower jaw forward. A continuous positive airway pressure (CPAP) device. This device blows air through a mask when you breathe out (exhale). A nasal expiratory positive airway pressure (EPAP) device. This device has valves that you put into each nostril. A bi-level positive airway pressure (BPAP) device. This device blows air through a mask when you breathe in (inhale) and breathe out (exhale). Having surgery if other treatments do not work. During surgery, excess tissue is removed to create a wider airway. Follow these instructions at home: Lifestyle Make any lifestyle changes that your health care provider recommends. Eat a healthy, well-balanced diet. Take steps to lose weight if you are overweight. Avoid using depressants, including alcohol, sedatives, and narcotics. Do not use any products that contain nicotine or tobacco. These products include cigarettes, chewing tobacco, and vaping devices, such as e-cigarettes. If you need help quitting, ask your health  care provider. General instructions Take over-the-counter and prescription medicines only as told  by your health care provider. If you were given a device to open your airway while you sleep, use it only as told by your health care provider. If you are having surgery, make sure to tell your health care provider you have sleep apnea. You may need to bring your device with you. Keep all follow-up visits. This is important. Contact a health care provider if: The device that you received to open your airway during sleep is uncomfortable or does not seem to be working. Your symptoms do not improve. Your symptoms get worse. Get help right away if: You develop: Chest pain. Shortness of breath. Discomfort in your back, arms, or stomach. You have: Trouble speaking. Weakness on one side of your body. Drooping in your face. These symptoms may represent a serious problem that is an emergency. Do not wait to see if the symptoms will go away. Get medical help right away. Call your local emergency services (911 in the [place] Do not drive yourself to the hospital. Summary Sleep apnea is a condition in which breathing pauses or becomes shallow during sleep. The most common cause is a collapsed or blocked airway. The goal of treatment is to restore normal breathing and to ease symptoms during sleep. This information is not intended to replace advice given to you by your health care provider. Make sure you discuss any questions you have with your healthcare provider. Document Revised: 08/30/2020 Document Reviewed: 08/30/2020 Elsevier Patient Education  2022 [place] is when you suddenly get weak or dizzy, or you feel like you might pass out (faint). This may also be called presyncope. This is due to a lack of blood flow to the brain. During an episode of near-syncope, you may: Feel dizzy, weak, or light-headed. Feel sick to your stomach (nauseous). See all white or all black. See spots. Have cold, clammy skin. This condition is caused by a sudden decrease in blood  flow to the brain. This decrease can result from various causes, but most of those causes are not dangerous. However, near-syncope may be a sign of a serious medical problem, soit is important to seek medical care. Follow these instructions at home: Medicines Take over-the-counter and prescription medicines only as told by your doctor. If you are taking blood pressure or heart medicine, get up slowly and spend many minutes getting ready to sit and then stand. This can help with dizziness. General instructions Be aware of any changes in your symptoms. Talk with your doctor about your symptoms. You may need to have testing to find the cause of your near-syncope. If you start to feel like you might pass out, lie down right away. Raise (elevate) your feet above the level of your heart. Breathe deeply and steadily. Wait until all of the symptoms are gone. Have someone stay with you until you feel stable. Do not drive, use machinery, or play sports until your doctor says it is okay. Drink enough fluid to keep your pee (urine) pale yellow. Keep all follow-up visits as told by your doctor. This is important. Get help right away if you: Have a seizure. Have pain in your: Chest. Belly (abdomen). Back. Faint once or more than once. Have a very bad headache. Are bleeding from your mouth or butt. Have black or tarry poop (stool). Have a very fast or uneven heartbeat (palpitations). Are mixed up (confused). Have trouble walking. Are very weak. Have trouble  seeing. These symptoms may be an emergency. Do not wait to see if the symptoms will go away. Get medical help right away. Call your local emergency services (911 in the [place] Do not drive yourself to the hospital. Summary Near-syncope is when you suddenly get weak or dizzy, or you feel like you might pass out (faint). This condition is caused by a lack of blood flow to the brain. Near-syncope may be a sign of a serious medical problem, so it is  important to seek medical care. This information is not intended to replace advice given to you by your health care provider. Make sure you discuss any questions you have with your healthcare provider. Document Revised: 01/13/2019 Document Reviewed: 08/10/2018 Elsevier Patient Education  2022 [employer].

## 2021-04-28 ENCOUNTER — Ambulatory Visit (INDEPENDENT_AMBULATORY_CARE_PROVIDER_SITE_OTHER): Payer: Medicare Other | Admitting: Ophthalmology

## 2021-04-28 ENCOUNTER — Other Ambulatory Visit: Payer: Self-pay

## 2021-04-28 ENCOUNTER — Encounter (INDEPENDENT_AMBULATORY_CARE_PROVIDER_SITE_OTHER): Payer: Self-pay | Admitting: Ophthalmology

## 2021-04-28 DIAGNOSIS — H35352 Cystoid macular degeneration, left eye: Secondary | ICD-10-CM

## 2021-04-28 DIAGNOSIS — E113312 Type 2 diabetes mellitus with moderate nonproliferative diabetic retinopathy with macular edema, left eye: Secondary | ICD-10-CM | POA: Diagnosis not present

## 2021-04-28 DIAGNOSIS — H35072 Retinal telangiectasis, left eye: Secondary | ICD-10-CM | POA: Diagnosis not present

## 2021-04-28 NOTE — Assessment & Plan Note (Signed)
OS, with much less intraretinal fluid and CME noted today on OCT.  This is coincident with 90 days continuous use of CPAP.  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  I explained the patient that this is no guarantee that macular edema will develop but this certainly decreases macular hypoxia as well as obviously CNS hypoxia if she continues to be able to use a CPAP or its equivalent

## 2021-04-28 NOTE — Progress Notes (Signed)
04/28/2021     CHIEF COMPLAINT Patient presents for Retina Follow Up (5 month fu OU and OCT- Pt was suppose to be 6 week but was delayed d/t Sleep Study/Pt states VA OU stable since last visit. Pt denies FOL, floaters, or ocular pain OU. [name]: 7.1/LBS: 085/Pt has been on CPAP for almost 90 days/)   HISTORY OF PRESENT ILLNESS: Kelly Glover is a 70 y.o. female who presents to the clinic today for:   HPI     Retina Follow Up           Diagnosis: Other   Laterality: left eye   Onset: 5 months ago   Severity: mild   Duration: 5 months   Course: stable   Comments: 5 month fu OU and OCT- Pt was suppose to be 6 week but was delayed d/t Sleep Study Pt states VA OU stable since last visit. Pt denies FOL, floaters, or ocular pain OU.  A1C: 7.1 LBS: 085 Pt has been on CPAP for almost 90 days        Last edited by Kendra Opitz, COA on 04/28/2021  9:05 AM.      Referring physician: Reynold Bowen, MD [place],  [place] [address]  HISTORICAL INFORMATION:   Selected notes from the [facility]: No current outpatient medications on file. (Ophthalmic Drugs)   No current facility-administered medications for this visit. (Ophthalmic Drugs)   Current Outpatient Medications (Other)  Medication Sig   aspirin EC 81 MG tablet Take 81 mg by mouth daily.   cetirizine (ZYRTEC) 10 MG tablet Take 10 mg by mouth daily.   Cholecalciferol (VITAMIN D) 2000 UNITS CAPS Take 1 capsule by mouth daily.   estradiol (ESTRACE) 2 MG tablet    ezetimibe (ZETIA) 10 MG tablet Take 10 mg by mouth daily.   famotidine (PEPCID AC) 10 MG chewable tablet Chew 10 mg by mouth daily.    FOLIC ACID PO Take 034 mcg by mouth daily.    Ibuprofen-Diphenhydramine Cit (ADVIL PM PO) Take 0.5 tablets by mouth daily as needed (sleep).   JARDIANCE 25 MG TABS tablet Take 25 mg by mouth daily.   NOVOLOG 100 UNIT/ML injection Inject into the skin as directed. Insulin pump    omeprazole-sodium bicarbonate (ZEGERID) 40-1100 MG per capsule Take 1 capsule by mouth daily before breakfast.   ondansetron (ZOFRAN ODT) 4 MG disintegrating tablet Take 1 tablet (4 mg total) by mouth every 8 (eight) hours as needed for nausea.   ONE TOUCH ULTRA TEST test strip    ramipril (ALTACE) 2.5 MG capsule Take 1 capsule by mouth daily.   rosuvastatin (CRESTOR) 40 MG tablet Take 40 mg by mouth daily.    sertraline (ZOLOFT) 100 MG tablet Take 50 mg by mouth daily.    No current facility-administered medications for this visit. (Other)      REVIEW OF SYSTEMS:    ALLERGIES Allergies  Allergen Reactions   Diflucan [Fluconazole]     Itching, palms red, throat itchy.    Scallops [Shellfish Allergy] Itching    Redness, throat constriction   Percocet [Oxycodone-Acetaminophen] Other (See Comments)    Diaphoresis, nausea, confusion.    Doxycycline Itching and Rash    Itchy throat   Griseofulvin Rash    PAST MEDICAL HISTORY Past Medical History:  Diagnosis Date   Diabetes ([place])    History of diverticulitis    History of kidney stones  Mast cell disorder    inappropriate mast cell activation   Neuromuscular disorder (H[facility])    inappropriate mast cell response disorder    PONV (postoperative nausea and vomiting)    Squamous cell carcinoma of left lower leg 2015   Past Surgical History:  Procedure Laterality Date   BREAST BIOPSY Left ?   benign   DIAGNOSTIC LAPAROSCOPY     RECTOCELE REPAIR N/A 09/17/2014   Procedure: POSTERIOR REPAIR , enterocele repair;  Surgeon: Jamey Reas de Berton Lan, MD;  Location: [facility];  Service: Gynecology;  Laterality: N/A;   TONSILLECTOMY  childhood   TOTAL ABDOMINAL HYSTERECTOMY W/ BILATERAL SALPINGOOPHORECTOMY  05/20/98    FAMILY HISTORY Family History  Problem Relation Age of Onset   Diabetes Mother    Heart disease Mother    Heart disease Father    Thyroid disease Sister    Asthma Maternal Grandmother 2   Diabetes  Maternal Grandmother    Breast cancer Maternal Grandmother    Diabetes Maternal Grandfather    Graves' disease Son    Thyroid disease Son     SOCIAL HISTORY Social History   Tobacco Use   Smoking status: Never   Smokeless tobacco: Never  Substance Use Topics   Alcohol use: No    Alcohol/week: 0.0 standard drinks   Drug use: No         OPHTHALMIC EXAM:  Base Eye Exam     Visual Acuity (ETDRS)       Right Left   Dist [place] 20/40 20/30   Dist ph [place] 20/25 20/25 -1         Tonometry (Tonopen, 9:09 AM)       Right Left   Pressure 14 12         Pupils       Pupils Dark Light Shape React APD   Right PERRL 5 4 Round Brisk None   Left PERRL 5 4 Round Brisk None         Visual Fields (Counting fingers)       Left Right    Full Full         Extraocular Movement       Right Left    Full Full         Neuro/Psych     Oriented x3: Yes   Mood/Affect: Normal         Dilation     Both eyes: 1.0% Mydriacyl, 2.5% Phenylephrine @ 9:09 AM           Slit Lamp and Fundus Exam     External Exam       Right Left   External Normal Normal         Slit Lamp Exam       Right Left   Lids/Lashes Normal Normal   Conjunctiva/Sclera White and quiet White and quiet   Cornea Clear Clear   Anterior Chamber Deep and quiet Deep and quiet   Iris Round and reactive Round and reactive   Lens Centered posterior chamber intraocular lens Centered posterior chamber intraocular lens   Anterior Vitreous Normal Normal            IMAGING AND PROCEDURES  Imaging and Procedures for 04/28/21  OCT, Retina - OU - Both Eyes       Right Eye Quality was good. Scan locations included subfoveal. Central Foveal Thickness: 258. Progression has no prior data. Findings include normal foveal contour.   Left Eye Quality  was good. Scan locations included subfoveal. Central Foveal Thickness: 266. Progression has no prior data. Findings include abnormal foveal contour.    Notes Much less CME and perifoveal CME only now on 2 slices in the left eye as compared to 4 slices at initial Onset of OCT measures January 2022.  This follows coincidentally 90 days of continuous CPAP use.  No other specific therapy has been undertaken for diabetic macular edema.  Thus this is likely MAC-TEL and/or macular hypoxic stress that has been reduced and has subsided now with much less leakage once CPAP therapy has been successfully improved her nightly oxygenation.             ASSESSMENT/PLAN:  Type 2 macular telangiectasis, left OS, with much less intraretinal fluid and CME noted today on OCT.  This is coincident with 90 days continuous use of CPAP.  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  I explained the patient that this is no guarantee that macular edema will develop but this certainly decreases macular hypoxia as well as obviously CNS hypoxia if she continues to be able to use a CPAP or its equivalent      ICD-10-CM   1. Cystoid macular edema of left eye  H35.352 OCT, Retina - OU - Both Eyes    2. 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 Type 2 macular telangiectasis, left  H35.072       1.  Nonproliferative diabetic retinopathy with suspected CSME in the past.  This has resolved coincident with use of CPAP.  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  This has regressed and is improved by OCT findings.  Fluorescein angiography needs to be accomplished yet the imaging system today is not functioning with a clock with the cloud server  2.  Follow-up next for evaluation with OCT, and fluorescein angiography the left right  3.  Ophthalmic  Meds Ordered this visit:  No orders of the defined types were placed in this encounter.      Return for DILATE OU, COLOR FP, OPTOS FFA L/R, OCT.  There are no Patient Instructions on file for this visit.   Explained the diagnoses, plan, and follow up with the patient and they expressed understanding.  Patient expressed understanding of the importance of proper follow up care.   Clent Demark Kyler Germer M.D. Diseases & Surgery of the Retina and Vitreous Retina & Diabetic [place] 04/28/21     Abbreviations: M myopia (nearsighted); A astigmatism; H hyperopia (farsighted); P presbyopia; Mrx spectacle prescription;  CTL contact lenses; OD right eye; OS left eye; OU both eyes  XT exotropia; ET esotropia; PEK punctate epithelial keratitis; PEE punctate epithelial erosions; DES dry eye syndrome; MGD meibomian gland dysfunction; ATs artificial tears; PFAT's preservative free artificial tears; [place] nuclear sclerotic cataract; PSC posterior subcapsular cataract; ERM epi-retinal membrane; PVD posterior vitreous detachment; RD retinal detachment; DM diabetes mellitus; DR diabetic retinopathy; NPDR non-proliferative diabetic retinopathy; PDR proliferative diabetic retinopathy; CSME clinically significant macular edema; DME diabetic macular edema; dbh dot blot hemorrhages; CWS cotton wool spot; POAG primary open angle glaucoma; C/D cup-to-disc ratio; HVF humphrey visual field; GVF goldmann visual field; OCT optical coherence tomography; IOP intraocular pressure; BRVO Branch retinal vein occlusion; CRVO central retinal vein  occlusion; CRAO central retinal artery occlusion; BRAO branch retinal artery occlusion; RT retinal tear; SB scleral buckle; PPV pars plana vitrectomy; VH Vitreous hemorrhage; PRP panretinal laser photocoagulation; IVK intravitreal kenalog; VMT vitreomacular traction; MH Macular hole;  NVD neovascularization of the disc; NVE neovascularization elsewhere; AREDS age related eye disease study; ARMD age  related macular degeneration; POAG primary open angle glaucoma; EBMD epithelial/anterior basement membrane dystrophy; ACIOL anterior chamber intraocular lens; IOL intraocular lens; PCIOL posterior chamber intraocular lens; Phaco/IOL phacoemulsification with intraocular lens placement; [place] photorefractive keratectomy; LASIK laser assisted in situ keratomileusis; HTN hypertension; DM diabetes mellitus; COPD chronic obstructive pulmonary disease

## 2021-05-09 NOTE — Telephone Encounter (Signed)
Thank you for the note

## 2021-05-21 ENCOUNTER — Ambulatory Visit (INDEPENDENT_AMBULATORY_CARE_PROVIDER_SITE_OTHER): Payer: Medicare Other | Admitting: Ophthalmology

## 2021-05-21 ENCOUNTER — Encounter (INDEPENDENT_AMBULATORY_CARE_PROVIDER_SITE_OTHER): Payer: Self-pay | Admitting: Ophthalmology

## 2021-05-21 ENCOUNTER — Other Ambulatory Visit: Payer: Self-pay

## 2021-05-21 DIAGNOSIS — H35072 Retinal telangiectasis, left eye: Secondary | ICD-10-CM

## 2021-05-21 DIAGNOSIS — E113312 Type 2 diabetes mellitus with moderate nonproliferative diabetic retinopathy with macular edema, left eye: Secondary | ICD-10-CM

## 2021-05-21 DIAGNOSIS — H35352 Cystoid macular degeneration, left eye: Secondary | ICD-10-CM

## 2021-05-21 DIAGNOSIS — E113391 Type 2 diabetes mellitus with moderate nonproliferative diabetic retinopathy without macular edema, right eye: Secondary | ICD-10-CM

## 2021-05-21 NOTE — Assessment & Plan Note (Addendum)
No specific intravitreal rx for CSME documented on October 15, 2020 when the left eye had center involved CSME other than the use of CPAP over the last 115 days  Much less CME and perifoveal CME only now on 2 slices in the left eye as compared to 4-6  slices at initial Onset of OCT measures January 2022.  This follows coincidentally 115  days of continuous CPAP use.  No other specific therapy has been undertaken for diabetic macular edema.  Thus this is likely MAC-TEL and/or macular hypoxic stress that has been reduced and has subsided now with much less leakage once CPAP therapy has been successfully improved her nightly oxygenation.

## 2021-05-21 NOTE — Progress Notes (Signed)
05/21/2021     CHIEF COMPLAINT Patient presents for Retina Follow Up   HISTORY OF PRESENT ILLNESS: Kelly Glover is a 70 y.o. female who presents to the clinic today for:   HPI     Retina Follow Up           Diagnosis: Other   Laterality: both eyes   Onset: 3 weeks ago   Severity: mild   Duration: 3 weeks   Course: stable         Comments   3 week fu oct/fp ffa l/r  Pt states [facility] OU stable since last visit. Pt denies FOL, floaters, or ocular pain OU.  A1C: 7.1 LBS: 200       Last edited by Kendra Opitz, COA on 05/21/2021 10:56 AM.      Referring physician: Reynold Bowen, MD [place],  [place] [id number]  HISTORICAL INFORMATION:   Selected notes from the [facility]: No current outpatient medications on file. (Ophthalmic Drugs)   No current facility-administered medications for this visit. (Ophthalmic Drugs)   Current Outpatient Medications (Other)  Medication Sig   aspirin EC 81 MG tablet Take 81 mg by mouth daily.   cetirizine (ZYRTEC) 10 MG tablet Take 10 mg by mouth daily.   Cholecalciferol (VITAMIN D) 2000 UNITS CAPS Take 1 capsule by mouth daily.   estradiol (ESTRACE) 2 MG tablet    ezetimibe (ZETIA) 10 MG tablet Take 10 mg by mouth daily.   famotidine (PEPCID AC) 10 MG chewable tablet Chew 10 mg by mouth daily.    FOLIC ACID PO Take 786 mcg by mouth daily.    Ibuprofen-Diphenhydramine Cit (ADVIL PM PO) Take 0.5 tablets by mouth daily as needed (sleep).   JARDIANCE 25 MG TABS tablet Take 25 mg by mouth daily.   NOVOLOG 100 UNIT/ML injection Inject into the skin as directed. Insulin pump   omeprazole-sodium bicarbonate (ZEGERID) 40-1100 MG per capsule Take 1 capsule by mouth daily before breakfast.   ondansetron (ZOFRAN ODT) 4 MG disintegrating tablet Take 1 tablet (4 mg total) by mouth every 8 (eight) hours as needed for nausea.   ONE TOUCH ULTRA TEST test strip    ramipril (ALTACE) 2.5 MG  capsule Take 1 capsule by mouth daily.   rosuvastatin (CRESTOR) 40 MG tablet Take 40 mg by mouth daily.    sertraline (ZOLOFT) 100 MG tablet Take 50 mg by mouth daily.    No current facility-administered medications for this visit. (Other)      REVIEW OF SYSTEMS:    ALLERGIES Allergies  Allergen Reactions   Diflucan [Fluconazole]     Itching, palms red, throat itchy.    Scallops [Shellfish Allergy] Itching    Redness, throat constriction   Percocet [Oxycodone-Acetaminophen] Other (See Comments)    Diaphoresis, nausea, confusion.    Doxycycline Itching and Rash    Itchy throat   Griseofulvin Rash    PAST MEDICAL HISTORY Past Medical History:  Diagnosis Date   Diabetes ([place])    History of diverticulitis    History of kidney stones    Mast cell disorder    inappropriate mast cell activation   Neuromuscular disorder (HCC)    inappropriate mast cell response disorder    PONV (postoperative nausea and vomiting)    Squamous cell carcinoma of left lower leg 2015   Past Surgical History:  Procedure Laterality Date   BREAST BIOPSY Left ?  benign   DIAGNOSTIC LAPAROSCOPY     RECTOCELE REPAIR N/A 09/17/2014   Procedure: POSTERIOR REPAIR , enterocele repair;  Surgeon: Jamey Reas de Berton Lan, MD;  Location: [facility];  Service: Gynecology;  Laterality: N/A;   TONSILLECTOMY  childhood   TOTAL ABDOMINAL HYSTERECTOMY W/ BILATERAL SALPINGOOPHORECTOMY  05/20/98    FAMILY HISTORY Family History  Problem Relation Age of Onset   Diabetes Mother    Heart disease Mother    Heart disease Father    Thyroid disease Sister    Asthma Maternal Grandmother 5   Diabetes Maternal Grandmother    Breast cancer Maternal Grandmother    Diabetes Maternal Grandfather    Graves' disease Son    Thyroid disease Son     SOCIAL HISTORY Social History   Tobacco Use   Smoking status: Never   Smokeless tobacco: Never  Substance Use Topics   Alcohol use: No    Alcohol/week: 0.0  standard drinks   Drug use: No         OPHTHALMIC EXAM:  Base Eye Exam     Visual Acuity (ETDRS)       Right Left   Dist cc 20/25 20/25    Correction: Glasses         Tonometry (Tonopen, 11:00 AM)       Right Left   Pressure 10 11         Pupils       Pupils Dark Light Shape React APD   Right PERRL 5 4 Round Brisk None   Left PERRL 5 4 Round Brisk None         Visual Fields (Counting fingers)       Left Right    Full Full         Extraocular Movement       Right Left    Full Full         Neuro/Psych     Oriented x3: Yes   Mood/Affect: Normal         Dilation     Both eyes: 1.0% Mydriacyl, 2.5% Phenylephrine @ 11:00 AM           Slit Lamp and Fundus Exam     External Exam       Right Left   External Normal Normal         Slit Lamp Exam       Right Left   Lids/Lashes Normal Normal   Conjunctiva/Sclera White and quiet White and quiet   Cornea Clear Clear   Anterior Chamber Deep and quiet Deep and quiet   Iris Round and reactive Round and reactive   Lens Centered posterior chamber intraocular lens Centered posterior chamber intraocular lens   Anterior Vitreous Normal Normal         Fundus Exam       Right Left   Posterior Vitreous Posterior vitreous detachment Posterior vitreous detachment   Disc Normal Normal   C/D Ratio 0.35 0.35   Macula Normal Microaneurysms   Vessels NPDR- Moderate NPDR- Moderate   Periphery Normal Normal            IMAGING AND PROCEDURES  Imaging and Procedures for 05/22/21  OCT, Retina - OU - Both Eyes       Right Eye Quality was good. Scan locations included subfoveal. Central Foveal Thickness: 254. Progression has no prior data. Findings include normal foveal contour.   Left Eye Quality was good. Scan locations included subfoveal.  Central Foveal Thickness: 258. Progression has no prior data. Findings include abnormal foveal contour.   Notes Much less CME and perifoveal CME  only now on 2 slices in the left eye as compared to 4-6  slices at initial Onset of OCT measures January 2022.  This follows coincidentally 115  days of continuous CPAP use.  No other specific therapy has been undertaken for diabetic macular edema.  Thus this is likely MAC-TEL and/or macular hypoxic stress that has been reduced and has subsided now with much less leakage once CPAP therapy has been successfully improved her nightly oxygenation.     Color Fundus Photography Optos - OU - Both Eyes       Right Eye Progression has no prior data. Disc findings include normal observations. Macula : microaneurysms. Periphery : normal observations.   Left Eye Progression has no prior data. Disc findings include normal observations. Macula : microaneurysms. Periphery : normal observations.   Notes Left eye.  Incidental posterior vitreous detachment noted   OD, and OS with mild nonproliferative diabetic retinopathy detected by colors only     Fluorescein Angiography Optos (Transit OS)       Injection: 500 mg Fluorescein Sodium 10 %   Route: Intravenous, Site: Left Arm   NDC: [id number]   Right Eye   Progression has no prior data. Mid/Late phase findings include microaneurysm. Choroidal neovascularization is not present.   Left Eye   Progression has no prior data. Early phase findings include microaneurysm. Mid/Late phase findings include microaneurysm, leakage. Choroidal neovascularization is not present.   Notes OS, and OD with overall moderate nonproliferative diabetic retinopathy with scattered microaneurysms throughout the posterior pole.  Inferior perifoveal capillary network of multifocal microaneurysms with diffuse leakage at the 6:00 meridian to the FAZ OS, this corresponds with the area of microvascular leakage on OCT seen on previous examination and today.  Qualitatively less perifoveal leakage and less leakage along the inferior aspect of the foveal area vascular zone  along the retinal vessel at 6 meridian             ASSESSMENT/PLAN:  Type 2 macular telangiectasis, left Much less CME and perifoveal CME only now on 2 slices in the left eye as compared to 4-6  slices at initial Onset of OCT measures January 2022.  This follows coincidentally 115  days of continuous CPAP use.  No other specific therapy has been undertaken for diabetic macular edema.  Thus this is likely MAC-TEL and/or macular hypoxic stress that has been reduced and has subsided now with much less leakage once CPAP therapy has been successfully improved her nightly oxygenation.  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  This follows coincidentally 115  days of continuous CPAP use.  No other specific therapy has been undertaken for diabetic macular edema.  Thus this is likely MAC-TEL and/or macular hypoxic stress that has been reduced and has subsided now with much less leakage once CPAP therapy has been successfully improved her nightly oxygenation.     ICD-10-CM   1. Cystoid macular edema of left eye  H35.352 OCT,  Retina - OU - Both Eyes    Color Fundus Photography Optos - OU - Both Eyes    2. 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 Type 2 macular telangiectasis, left  H35.072 OCT, Retina - OU - Both Eyes    Color Fundus Photography  Optos - OU - Both Eyes    Fluorescein Angiography Optos (Transit OS)    Fluorescein Sodium 10 % injection 500 mg    4. Moderate nonproliferative diabetic retinopathy of right eye without macular edema associated with type 2 diabetes mellitus (HCC)  H41.9379 Color Fundus Photography Optos - OU - Both Eyes    Fluorescein Angiography Optos (Transit OS)    Fluorescein Sodium 10 % injection 500 mg      1.  Center involved CSME OS is now improved with the only therapy of continuous CPAP use for moderate degree of sleep apnea documented since onset of documented findings October 15, 2020.  Patient will continue on CPAP which prevents nightly hypoxic stress to the macular region in the face of moderate nonproliferative diabetic retinopathy  2.  OU with moderate operative diabetic retinopathy stable over time follow-up each eye in 6 months  3.  Ophthalmic Meds Ordered this visit:  Meds ordered this encounter  Medications   Fluorescein Sodium 10 % injection 500 mg       Return in about 6 months (around 11/21/2021) for DILATE OU, COLOR FP, OCT.  There are no Patient Instructions on file for this visit.   Explained the diagnoses, plan, and follow up with the patient and they expressed understanding.  Patient expressed understanding of the importance of proper follow up care.   Clent Demark Faustina Gebert M.D. Diseases & Surgery of the Retina and Vitreous Retina & Diabetic [place] 05/22/21     Abbreviations: M myopia (nearsighted); A astigmatism; H hyperopia (farsighted); P presbyopia; Mrx spectacle prescription;  CTL contact lenses; OD right eye; OS left eye; OU both eyes  XT exotropia; ET esotropia; PEK punctate epithelial keratitis; PEE punctate epithelial erosions; DES dry eye syndrome; MGD meibomian gland dysfunction; ATs artificial tears; PFAT's preservative free artificial tears; [place] nuclear sclerotic cataract; PSC posterior subcapsular cataract; ERM epi-retinal membrane; PVD posterior  vitreous detachment; RD retinal detachment; DM diabetes mellitus; DR diabetic retinopathy; NPDR non-proliferative diabetic retinopathy; PDR proliferative diabetic retinopathy; CSME clinically significant macular edema; DME diabetic macular edema; dbh dot blot hemorrhages; CWS cotton wool spot; POAG primary open angle glaucoma; C/D cup-to-disc ratio; HVF humphrey visual field; GVF goldmann visual field; OCT optical coherence tomography; IOP intraocular pressure; BRVO Branch retinal vein occlusion; CRVO central retinal vein occlusion; CRAO central retinal artery occlusion; BRAO branch retinal artery occlusion; RT retinal tear; SB scleral buckle; PPV pars plana vitrectomy; VH Vitreous hemorrhage; PRP panretinal laser photocoagulation; IVK intravitreal kenalog; VMT vitreomacular traction; MH Macular hole;  NVD neovascularization of the disc; NVE neovascularization elsewhere; AREDS age related eye disease study; ARMD age related macular degeneration; POAG primary open angle glaucoma; EBMD epithelial/anterior basement membrane dystrophy; ACIOL anterior chamber intraocular lens; IOL intraocular lens; PCIOL posterior chamber intraocular lens; Phaco/IOL phacoemulsification with intraocular lens placement; [place] photorefractive keratectomy; LASIK laser assisted in situ keratomileusis; HTN hypertension; DM diabetes mellitus; COPD  chronic obstructive pulmonary disease  

## 2021-05-21 NOTE — Assessment & Plan Note (Signed)
Much less CME and perifoveal CME only now on 2 slices in the left eye as compared to 4-6  slices at initial Onset of OCT measures January 2022.  This follows coincidentally 115  days of continuous CPAP use.  No other specific therapy has been undertaken for diabetic macular edema.  Thus this is likely MAC-TEL and/or macular hypoxic stress that has been reduced and has subsided now with much less leakage once CPAP therapy has been successfully improved her nightly oxygenation.

## 2021-05-22 DIAGNOSIS — Z794 Long term (current) use of insulin: Secondary | ICD-10-CM | POA: Diagnosis not present

## 2021-05-22 DIAGNOSIS — E668 Other obesity: Secondary | ICD-10-CM | POA: Diagnosis not present

## 2021-05-22 DIAGNOSIS — E10319 Type 1 diabetes mellitus with unspecified diabetic retinopathy without macular edema: Secondary | ICD-10-CM | POA: Diagnosis not present

## 2021-05-22 DIAGNOSIS — Z4681 Encounter for fitting and adjustment of insulin pump: Secondary | ICD-10-CM | POA: Diagnosis not present

## 2021-05-22 DIAGNOSIS — Z0000 Encounter for general adult medical examination without abnormal findings: Secondary | ICD-10-CM | POA: Diagnosis not present

## 2021-05-22 DIAGNOSIS — K760 Fatty (change of) liver, not elsewhere classified: Secondary | ICD-10-CM | POA: Diagnosis not present

## 2021-05-22 DIAGNOSIS — G4730 Sleep apnea, unspecified: Secondary | ICD-10-CM | POA: Diagnosis not present

## 2021-05-22 DIAGNOSIS — I700 Atherosclerosis of aorta: Secondary | ICD-10-CM | POA: Diagnosis not present

## 2021-05-22 DIAGNOSIS — I10 Essential (primary) hypertension: Secondary | ICD-10-CM | POA: Diagnosis not present

## 2021-05-22 DIAGNOSIS — M8580 Other specified disorders of bone density and structure, unspecified site: Secondary | ICD-10-CM | POA: Diagnosis not present

## 2021-05-22 DIAGNOSIS — E785 Hyperlipidemia, unspecified: Secondary | ICD-10-CM | POA: Diagnosis not present

## 2021-05-22 DIAGNOSIS — K5730 Diverticulosis of large intestine without perforation or abscess without bleeding: Secondary | ICD-10-CM | POA: Diagnosis not present

## 2021-05-22 MED ORDER — FLUORESCEIN SODIUM 10 % IV SOLN
500.0000 mg | INTRAVENOUS | Status: AC | PRN
  Administered 2021-05-22: 500 mg via INTRAVENOUS

## 2021-06-27 DIAGNOSIS — Z23 Encounter for immunization: Secondary | ICD-10-CM | POA: Diagnosis not present

## 2021-07-21 DIAGNOSIS — Z23 Encounter for immunization: Secondary | ICD-10-CM | POA: Diagnosis not present

## 2021-08-13 DIAGNOSIS — L820 Inflamed seborrheic keratosis: Secondary | ICD-10-CM | POA: Diagnosis not present

## 2021-08-13 DIAGNOSIS — Z23 Encounter for immunization: Secondary | ICD-10-CM | POA: Diagnosis not present

## 2021-08-13 DIAGNOSIS — L821 Other seborrheic keratosis: Secondary | ICD-10-CM | POA: Diagnosis not present

## 2021-08-19 DIAGNOSIS — Z4681 Encounter for fitting and adjustment of insulin pump: Secondary | ICD-10-CM | POA: Diagnosis not present

## 2021-08-19 DIAGNOSIS — I10 Essential (primary) hypertension: Secondary | ICD-10-CM | POA: Diagnosis not present

## 2021-08-19 DIAGNOSIS — Z794 Long term (current) use of insulin: Secondary | ICD-10-CM | POA: Diagnosis not present

## 2021-08-19 DIAGNOSIS — E10319 Type 1 diabetes mellitus with unspecified diabetic retinopathy without macular edema: Secondary | ICD-10-CM | POA: Diagnosis not present

## 2021-08-19 DIAGNOSIS — I700 Atherosclerosis of aorta: Secondary | ICD-10-CM | POA: Diagnosis not present

## 2021-10-21 ENCOUNTER — Encounter: Payer: Self-pay | Admitting: Neurology

## 2021-10-21 DIAGNOSIS — Z23 Encounter for immunization: Secondary | ICD-10-CM | POA: Diagnosis not present

## 2021-10-21 DIAGNOSIS — Z808 Family history of malignant neoplasm of other organs or systems: Secondary | ICD-10-CM | POA: Diagnosis not present

## 2021-10-21 DIAGNOSIS — Z85828 Personal history of other malignant neoplasm of skin: Secondary | ICD-10-CM | POA: Diagnosis not present

## 2021-10-21 DIAGNOSIS — L821 Other seborrheic keratosis: Secondary | ICD-10-CM | POA: Diagnosis not present

## 2021-10-21 DIAGNOSIS — D2262 Melanocytic nevi of left upper limb, including shoulder: Secondary | ICD-10-CM | POA: Diagnosis not present

## 2021-10-21 DIAGNOSIS — L578 Other skin changes due to chronic exposure to nonionizing radiation: Secondary | ICD-10-CM | POA: Diagnosis not present

## 2021-10-22 ENCOUNTER — Ambulatory Visit (INDEPENDENT_AMBULATORY_CARE_PROVIDER_SITE_OTHER): Payer: Medicare Other | Admitting: Neurology

## 2021-10-22 ENCOUNTER — Encounter: Payer: Self-pay | Admitting: Neurology

## 2021-10-22 VITALS — BP 179/77 | HR 79 | Ht 66.0 in

## 2021-10-22 DIAGNOSIS — E113312 Type 2 diabetes mellitus with moderate nonproliferative diabetic retinopathy with macular edema, left eye: Secondary | ICD-10-CM

## 2021-10-22 DIAGNOSIS — G4734 Idiopathic sleep related nonobstructive alveolar hypoventilation: Secondary | ICD-10-CM | POA: Diagnosis not present

## 2021-10-22 DIAGNOSIS — G4733 Obstructive sleep apnea (adult) (pediatric): Secondary | ICD-10-CM

## 2021-10-22 DIAGNOSIS — Z9641 Presence of insulin pump (external) (internal): Secondary | ICD-10-CM | POA: Diagnosis not present

## 2021-10-22 NOTE — Patient Instructions (Signed)

## 2021-10-22 NOTE — Progress Notes (Signed)
SLEEP MEDICINE CLINIC    Provider:  Larey Seat, MD  Primary Care Physician:  Reynold Bowen, MD [place] [address]     Referring Provider: Dr Deloria Lair, MD  - Ophthalmology       Chief Complaint according to patient   Patient presents with:     New Patient (Initial Visit)     RV 10-22-2021      HISTORY OF PRESENT ILLNESS:  Kelly Glover is a 71 y.o.  Caucasian female patient seen here in a RV on 10/22/2021 :  Patient has used her new CPAP with some effort but also with success, and she made an interesting discovery- she used a pulse-oximetry device and fell asleep without CPAP in place,then put CPAP on at 1.45 AM and the oxygen levels were clearly better and less variable , her pulse rate was slower.  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 The Compliance record is excellent. The  couple has been on a Cruise, a wedding in [place] and contracted Covid 19. She recovered well from flu like symptoms. Treated with Paxlovid- She would like to purchase a travel CPAP- Epworth 3 points !   Much improved.  LUNA machine - she assured me she used it 28 nights but this is not recorded: low compliance on this months record.  5-12 cm water range, 14/ 24 days- average use 4.30 minutes.  95% pressure of 8.3 cm water, which would be setting for her travel CPAP. Variable leaks- nasal mask, FFM- she wants to try a nasal pillow. Bella swift ear loops. Please order and fit.         Kelly Glover is here Glover for a follow-up she underwent a split-night polysomnography study on December 04, 2020 which revealed apnea to be present this was an mild apnea category with an AHI of 15.8 and this to have much higher REM sleep AHI than non-REM sleep AHI.  Supine sleep also accentuated the apnea hypopnea index 25.9/h versus a nonsupine apnea-hypopnea index of 10.3.  There were prolonged periods of hypoxemia hypoxia at nadir was 69% saturation.  So the treatment for  this constellation is usually a positive airway pressure therapy the patient started on CPAP. Dr Zadie Rhine, ophthalmologist-the mean pressure usage is 7.1 cmH2O the average 95th percentile pressure is 9.1 cmH2O these are still mild pressures required to overcome this patient's apnea.  She has very minimal air leakage on average 2 minutes she has an average leak of 7.5 L/min that is mild as well and her residual apnea hypopnea index is now 2.4 which is a good resolution of sleep apnea.  She has used the machine 30 out of 30 days the total usage and date range was 5 hours 32 minutes on average this fulfills the criteria of compliance by her insurance company.  Ramp pressure is 4 cmH2O minimum pressure maintained is 5 and maximum pressure is 12 cmH2O.  She has access to oximetry and this documented resolution of hypoxemia. This will please dr Zadie Rhine as well  Epworth score: 2/24  Nmore morning headache !  ROS: 30 sec drop attack, looking pale. Had 2 of these- not sure of cause.         from Dr Zadie Rhine, concerned about hypoxemia at night , measured by ring, and cystoid macular edema. .  Chief concern according to patient :  Rm 11 with husband, Dr. Harrington Challenger.  Pt reports she is here to discuss  sleep study per Dr. 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 She denies prior sleep study and reports she does snore at night.    I have the pleasure of seeing Kelly Glover, a right -handed Caucasian female with a possible sleep disorder.  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     Sleep relevant medical history: Nocturia once at 4.30 AM. Family medical /sleep history: 2 sisters   suspected to have OSA, no insomnia, no sleep walkers.    Social history:  adult children , some out of state. Husband is MD> Patient is retired, homemaker, and lives in a household with 2 persons, 3 grandchildren visit often.  Tobacco use; never.  ETOH use ; none ,  Caffeine intake in form of Coffee( all day, 2 cups  In AM one at lunch).         Sleep habits are as follows: The patient's dinner time is between 5-7 PM. The patient goes to bed at 10.30 PM and continues to sleep for 5 hours, wakes for one bathroom breaks.  She likes the bedroom cool, quiet and dark.   The preferred sleep position is side , with the support of 2 pillows.  Dreams are reportedly infrequent/vivid.  7 AM is the usual rise time. The patient wakes up spontaneously.   She reports  feeling refreshed and restored in AM, lately she had headaches in AM with symptoms such as dry mouth, morning headaches. No Naps are taken.Given the documented hypoxemia I suspect that the patient will have a REM dependent sleep apnea.  The longest periods of oxygen desaturation all of her in the early morning hours and are not related to her usual bathroom break time.  Follow while there is some motion artifact around 430 there are prolonged hypoxemia.  Already at 230 and also is late at 6 AM.  And this has been repeatedly demonstrated throughout the night.  Her pulse range does not respond as nimble to these oxygen desaturations.  She has been taking metoprolol as needed and not on a regular basis and this could affect the response of this of her heart rate.  The ring will not allow me to see if she has arrhythmia only a heart rate variability can be demonstrated and her pulse rate at the highest was only 78 bpm.  The lowest oxygen saturation at nadir 77%.  I agree with Dr. Zadie Rhine that an evaluation and treatment for sleep apnea should be performed given that this may reverse some of the macular edema especially in the left eye.     Review of  Systems: Out of a complete 14 system review, the patient complains of only the following symptoms, and all other reviewed systems are negative.:   loud snoring,   How likely are you to doze in the following situations: 0 = not likely, 1 = slight chance, 2 = moderate chance, 3 = high chance   Sitting and Reading? Watching Television? Sitting inactive in a public place (theater or meeting)? As a passenger in a car for an hour without a break? Lying down in the afternoon when circumstances permit? Sitting and talking to someone? Sitting quietly after lunch without alcohol? In  a car, while stopped for a few minutes in traffic?   Total = 4/ 24 points - unchanged   FSS endorsed at 12/ 63 points.   Social History   Socioeconomic History   Marital status: Married    Spouse name: Not on file   Number of children: Not on file   Years of education: Not on file   Highest education level: Not on file  Occupational History   Not on file  Tobacco Use   Smoking status: Never   Smokeless tobacco: Never  Substance and Sexual Activity   Alcohol use: No    Alcohol/week: 0.0 standard drinks   Drug use: No   Sexual activity: Yes    Partners: Male    Birth control/protection: Surgical    Comment: hysterectomy  Other Topics Concern   Not on file  Social History Narrative   Not on file   Social Determinants of Health   Financial Resource Strain: Not on file  Food Insecurity: Not on file  Transportation Needs: Not on file  Physical Activity: Not on file  Stress: Not on file  Social Connections: Not on file    Family History  Problem Relation Age of Onset   Diabetes Mother    Heart disease Mother    Heart disease Father    Thyroid disease Sister    Asthma Maternal Grandmother 65   Diabetes Maternal Grandmother    Breast cancer Maternal Grandmother    Diabetes Maternal Alanda Slim' disease Son    Thyroid disease Son     Past Medical History:  Diagnosis Date    Diabetes ([place])    History of diverticulitis    History of kidney stones    Mast cell disorder    inappropriate mast cell activation   Neuromuscular disorder ([facility])    inappropriate mast cell response disorder    PONV (postoperative nausea and vomiting)    Squamous cell carcinoma of left lower leg 2015    Past Surgical History:  Procedure Laterality Date   BREAST BIOPSY Left ?   benign   DIAGNOSTIC LAPAROSCOPY     RECTOCELE REPAIR N/A 09/17/2014   Procedure: POSTERIOR REPAIR , enterocele repair;  Surgeon: Jamey Reas de Berton Lan, MD;  Location: [facility];  Service: Gynecology;  Laterality: N/A;   TONSILLECTOMY  childhood   TOTAL ABDOMINAL HYSTERECTOMY W/ BILATERAL SALPINGOOPHORECTOMY  05/20/98     Current Outpatient Medications on File Prior to Visit  Medication Sig Dispense Refill   aspirin EC 81 MG tablet Take 81 mg by mouth daily.     cetirizine (ZYRTEC) 10 MG tablet Take 10 mg by mouth daily.     Cholecalciferol (VITAMIN D) 2000 UNITS CAPS Take 1 capsule by mouth daily.     ezetimibe (ZETIA) 10 MG tablet Take 10 mg by mouth daily.  0   famotidine (PEPCID AC) 10 MG chewable tablet Chew 10 mg by mouth daily.      FOLIC ACID PO Take 160 mcg by mouth daily.      Ibuprofen-Diphenhydramine Cit (ADVIL PM PO) Take 0.5 tablets by mouth daily as needed (sleep).     JARDIANCE 25 MG TABS tablet Take 25 mg by mouth daily.     NOVOLOG 100 UNIT/ML injection Inject into the skin as directed. Insulin pump     omeprazole-sodium bicarbonate (ZEGERID) 40-1100 MG per capsule Take 1 capsule by mouth daily before breakfast.     ONE TOUCH ULTRA TEST test strip  ramipril (ALTACE) 2.5 MG capsule Take 1 capsule by mouth daily.     rosuvastatin (CRESTOR) 40 MG tablet Take 40 mg by mouth daily.      sertraline (ZOLOFT) 100 MG tablet Take 50 mg by mouth daily.      No current facility-administered medications on file prior to visit.    Allergies  Allergen Reactions   Diflucan  [Fluconazole]     Itching, palms red, throat itchy.    Scallops [Shellfish Allergy] Itching    Redness, throat constriction   Percocet [Oxycodone-Acetaminophen] Other (See Comments)    Diaphoresis, nausea, confusion.    Doxycycline Itching and Rash    Itchy throat   Griseofulvin Rash    Physical exam:  Glover's Vitals   10/22/21 0843  BP: (!) 179/77  Pulse: 79  Height: 5' 6"  (1.676 m)   Body mass index is 28.25 kg/m.   Wt Readings from Last 3 Encounters:  04/23/21 175 lb (79.4 kg)  11/07/20 175 lb (79.4 kg)  06/26/17 165 lb (74.8 kg)     Ht Readings from Last 3 Encounters:  10/22/21 5' 6"  (1.676 m)  04/23/21 5' 6"  (1.676 m)  11/07/20 5' 6"  (1.676 m)      General: The patient is awake, alert and appears not in acute distress. The patient is well groomed. Head: Normocephalic, atraumatic. Neck is supple. Mallampati 1-2 neck circumference: 16 inches . Nasal airflow patent.  Retrognathia is not seen.  Dental status:  Cardiovascular:  Regular rate and cardiac rhythm by pulse,  without distended neck veins. Respiratory: Lungs are clear to auscultation.  Skin:  Without evidence of ankle edema, or rash. Trunk: The patient's posture is erect.   Neurologic exam : The patient is awake and alert, oriented to place and time.   Memory subjective described as intact.  Attention span & concentration ability appears normal.  Speech is fluent,  without  dysarthria, dysphonia or aphasia.  Mood and affect are appropriate.   Cranial nerves: no loss of smell or taste reported  Pupils are equal and briskly reactive to light. Funduscopic exam deferred. .  Extraocular movements in vertical and horizontal planes were intact and without nystagmus.  No Diplopia. Visual fields by finger perimetry are intact. Hearing was intact to soft voice and finger rubbing.    Facial sensation intact to fine touch.  Facial motor strength is symmetric and tongue and uvula move midline.  Neck ROM :  rotation, tilt and flexion extension were normal for age and shoulder shrug was symmetrical.    Motor exam:  Symmetric bulk, tone and ROM.   Normal tone without cog -wheeling, symmetric grip strength- weakness. Weakness of thumb adduction and pinch strength- right dominant.    Sensory:  temperature, pressure and vibration were tested - she felt no vibration on the forefoot, but could feel coolness an pressure.   Proprioception tested in the upper extremities was normal.   Coordination: Rapid alternating movements in the fingers/hands were of normal speed.  The Finger-to-nose maneuver was intact without evidence of ataxia, dysmetria or tremor.  Gait and station: deferred /normal .       After spending a total time of 20 minutes face to face and additional time for physical and neurologic examination, review of laboratory studies,  personal review of imaging studies, reports and results of other testing and review of referral information / records as far as provided in visit, I have established the following assessments:  1) OSA with hypoxia and REM dependence,  sleeping on her side- doing well on CPAP with good resolution of OSA and HYPOXIA. No nocturia. Rare morning headaches.  2) new problem finger twitching when nervous.  3) slight, seconds lasting spells, ill defined.    My Plan is to proceed with:  1) DME order: Gets 95% pressure of 8.3 cm water, which would be setting for her travel CPAP. Variable leaks- nasal mask, FFM- she wants to try a nasal pillow. Bella swift ear loops. Please order and fit.    2) continuously using CPAP- awaiting Dr Dahlia Bailiff report on retinal health. She will see him tomorrow.   I would like to thank Deloria Lair, MD  for allowing me to meet with and to take care of this pleasant patient.  I plan to follow up either personally within 6 month.     CC: I will share my notes with Dr Forde Dandy, MD   Electronically signed by: Larey Seat, MD 10/22/2021  8:46 AM  [facility] Board certified by The [employer] of Sleep Medicine and Diplomate of the [employer] of Sleep Medicine. Board certified In Neurology through the [place], Fellow of the [employer] of Neurology. Medical Director of [facility].

## 2021-11-19 ENCOUNTER — Ambulatory Visit (INDEPENDENT_AMBULATORY_CARE_PROVIDER_SITE_OTHER): Payer: Medicare Other | Admitting: Ophthalmology

## 2021-11-19 ENCOUNTER — Other Ambulatory Visit: Payer: Self-pay

## 2021-11-19 ENCOUNTER — Encounter (INDEPENDENT_AMBULATORY_CARE_PROVIDER_SITE_OTHER): Payer: Self-pay | Admitting: Ophthalmology

## 2021-11-19 DIAGNOSIS — H35352 Cystoid macular degeneration, left eye: Secondary | ICD-10-CM

## 2021-11-19 DIAGNOSIS — G4733 Obstructive sleep apnea (adult) (pediatric): Secondary | ICD-10-CM | POA: Diagnosis not present

## 2021-11-19 DIAGNOSIS — E113312 Type 2 diabetes mellitus with moderate nonproliferative diabetic retinopathy with macular edema, left eye: Secondary | ICD-10-CM

## 2021-11-19 DIAGNOSIS — H35072 Retinal telangiectasis, left eye: Secondary | ICD-10-CM

## 2021-11-19 NOTE — Progress Notes (Signed)
11/19/2021     CHIEF COMPLAINT Patient presents for  Chief Complaint  Patient presents with   Cystoid Macular Edema      HISTORY OF PRESENT ILLNESS: Kelly Glover is a 71 y.o. female who presents to the clinic today for:   HPI   6 mos fu ou oct fp. Patient states vision is stable and unchanged since last visit. Denies any new floaters or FOL. Pt states eyes are dry and she thinks it is from the CPAP. Pt has been using CPAP almost 1 year.  Last edited by Laurin Coder on 11/19/2021  1:15 PM.      Referring physician: Reynold Bowen, MD [place],  [place] [address]  HISTORICAL INFORMATION:   Selected notes from the [facility]: No current outpatient medications on file. (Ophthalmic Drugs)   No current facility-administered medications for this visit. (Ophthalmic Drugs)   Current Outpatient Medications (Other)  Medication Sig   aspirin EC 81 MG tablet Take 81 mg by mouth daily.   cetirizine (ZYRTEC) 10 MG tablet Take 10 mg by mouth daily.   Cholecalciferol (VITAMIN D) 2000 UNITS CAPS Take 1 capsule by mouth daily.   ezetimibe (ZETIA) 10 MG tablet Take 10 mg by mouth daily.   famotidine (PEPCID AC) 10 MG chewable tablet Chew 10 mg by mouth daily.    FOLIC ACID PO Take 680 mcg by mouth daily.    Ibuprofen-Diphenhydramine Cit (ADVIL PM PO) Take 0.5 tablets by mouth daily as needed (sleep).   JARDIANCE 25 MG TABS tablet Take 25 mg by mouth daily.   NOVOLOG 100 UNIT/ML injection Inject into the skin as directed. Insulin pump   omeprazole-sodium bicarbonate (ZEGERID) 40-1100 MG per capsule Take 1 capsule by mouth daily before breakfast.   ONE TOUCH ULTRA TEST test strip    ramipril (ALTACE) 2.5 MG capsule Take 1 capsule by mouth daily.   rosuvastatin (CRESTOR) 40 MG tablet Take 40 mg by mouth daily.    sertraline (ZOLOFT) 100 MG tablet Take 50 mg by mouth daily.    No current facility-administered medications for this  visit. (Other)      REVIEW OF SYSTEMS:    ALLERGIES Allergies  Allergen Reactions   Diflucan [Fluconazole]     Itching, palms red, throat itchy.    Scallops [Shellfish Allergy] Itching    Redness, throat constriction   Percocet [Oxycodone-Acetaminophen] Other (See Comments)    Diaphoresis, nausea, confusion.    Doxycycline Itching and Rash    Itchy throat   Griseofulvin Rash    PAST MEDICAL HISTORY Past Medical History:  Diagnosis Date   Diabetes ([place])    History of diverticulitis    History of kidney stones    Mast cell disorder    inappropriate mast cell activation   Neuromuscular disorder ([facility])    inappropriate mast cell response disorder    PONV (postoperative nausea and vomiting)    Squamous cell carcinoma of left lower leg 2015   Past Surgical History:  Procedure Laterality Date   BREAST BIOPSY Left ?   benign   DIAGNOSTIC LAPAROSCOPY     RECTOCELE REPAIR N/A 09/17/2014   Procedure: POSTERIOR REPAIR , enterocele repair;  Surgeon: Jamey Reas de Berton Lan, MD;  Location: [facility];  Service: Gynecology;  Laterality: N/A;   TONSILLECTOMY  childhood   TOTAL ABDOMINAL HYSTERECTOMY W/ BILATERAL SALPINGOOPHORECTOMY  05/20/98    FAMILY HISTORY Family History  Problem Relation Age of Onset   Diabetes Mother    Heart disease Mother    Heart disease Father    Thyroid disease Sister    Asthma Maternal Grandmother 2   Diabetes Maternal Grandmother    Breast cancer Maternal Grandmother    Diabetes Maternal Grandfather    Graves' disease Son    Thyroid disease Son     SOCIAL HISTORY Social History   Tobacco Use   Smoking status: Never   Smokeless tobacco: Never  Substance Use Topics   Alcohol use: No    Alcohol/week: 0.0 standard drinks   Drug use: No         OPHTHALMIC EXAM:  Base Eye Exam     Visual Acuity (ETDRS)       Right Left   Dist cc 20/25 -1+2 20/20 -2    Correction: Glasses         Tonometry (Tonopen, 1:17 PM)        Right Left   Pressure 14 15         Pupils       Pupils Dark Light Shape APD   Right PERRL 5 4 Round None   Left PERRL 5 4 Round None         Extraocular Movement       Right Left    Full Full         Neuro/Psych     Oriented x3: Yes   Mood/Affect: Normal         Dilation     Both eyes: 1.0% Mydriacyl, 2.5% Phenylephrine @ 1:17 PM           Slit Lamp and Fundus Exam     External Exam       Right Left   External Normal Normal         Slit Lamp Exam       Right Left   Lids/Lashes Normal Normal   Conjunctiva/Sclera White and quiet White and quiet   Cornea Clear Clear   Anterior Chamber Deep and quiet Deep and quiet   Iris Round and reactive Round and reactive   Lens Centered posterior chamber intraocular lens Centered posterior chamber intraocular lens   Anterior Vitreous Normal Normal         Fundus Exam       Right Left   Posterior Vitreous Posterior vitreous detachment Posterior vitreous detachment   Disc Normal Normal   C/D Ratio 0.35 0.35   Macula Normal Microaneurysms fewer around the macula   Vessels NPDR- Moderate NPDR- Moderate   Periphery Normal Normal            IMAGING AND PROCEDURES  Imaging and Procedures for 11/19/21  OCT, Retina - OU - Both Eyes       Right Eye Quality was good. Scan locations included subfoveal. Central Foveal Thickness: 254. Progression has no prior data. Findings include normal foveal contour.   Left Eye Quality was good. Scan locations included subfoveal. Central Foveal Thickness: 249. Progression has no prior data.   Notes Much less CME and perifoveal CME now completely resolved as compared to January 2022, in the left eye as compared to 4-6  slices at initial Onset of OCT measures January 2022.    This follows coincidentally 10 months of continuous CPAP use.  No other specific therapy has been undertaken for diabetic macular edema.  Thus this is likely MAC-TEL and/or macular  hypoxic stress that has been reduced and has subsided  now with much less leakage once CPAP therapy has been successfully improved her nightly oxygenation.     Color Fundus Photography Optos - OU - Both Eyes       Right Eye Progression has no prior data. Disc findings include normal observations. Macula : microaneurysms. Periphery : normal observations.   Left Eye Progression has no prior data. Disc findings include normal observations. Macula : microaneurysms. Periphery : normal observations.   Notes Left eye.  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 Cystoid macular edema of left eye  H35.352 OCT, Retina - OU - Both Eyes    Color Fundus Photography Optos - OU - Both Eyes    2. Type 2 macular telangiectasis, left  H35.072     3. 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 Obstructive sleep apnea syndrome in adult  G47.33       1.  OU, stable over time, moderate NPDR  2.  OS with microscopic CME from MAC-TEL  in my opinion, has now stabilized and in fact improved over time with cessation of nightly hypoxic and hypertensive damage from undiagnosed and previously untreated sleep apnea.  3.  Ophthalmic Meds Ordered this visit:  No orders of the defined types were placed in this encounter.      Return in about 9 months (around 08/19/2022) for DILATE OU, OCT.  There are no Patient Instructions on file for this visit.   Explained the diagnoses, plan, and follow up with the patient and they expressed understanding.  Patient expressed understanding of the importance of proper follow up care.   Clent Demark Salote Weidmann M.D. Diseases & Surgery of the Retina and Vitreous Retina & Diabetic [place] 11/19/21     Abbreviations: M myopia (nearsighted); A astigmatism; H hyperopia (farsighted); P presbyopia; Mrx spectacle prescription;  CTL contact lenses; OD right eye; OS left eye; OU both eyes  XT exotropia; ET esotropia; PEK punctate epithelial keratitis; PEE punctate epithelial erosions; DES dry eye syndrome; MGD meibomian gland dysfunction; ATs artificial tears; PFAT's preservative free artificial tears; Gay nuclear sclerotic cataract; PSC posterior subcapsular cataract; ERM epi-retinal membrane; PVD posterior vitreous detachment; RD retinal detachment; DM diabetes mellitus; DR diabetic retinopathy; NPDR non-proliferative diabetic retinopathy; PDR proliferative diabetic retinopathy; CSME clinically significant macular edema; DME diabetic macular edema; dbh dot blot hemorrhages; CWS cotton wool spot; POAG primary open angle glaucoma; C/D cup-to-disc ratio; HVF humphrey visual field; GVF goldmann visual field; OCT optical coherence tomography; IOP intraocular pressure; BRVO Branch retinal vein occlusion; CRVO central retinal vein occlusion; CRAO  central retinal artery occlusion; BRAO branch retinal artery occlusion; RT retinal tear; SB scleral buckle; PPV pars plana vitrectomy; VH Vitreous hemorrhage; PRP panretinal  laser photocoagulation; IVK intravitreal kenalog; VMT vitreomacular traction; MH Macular hole;  NVD neovascularization of the disc; NVE neovascularization elsewhere; AREDS age related eye disease study; ARMD age related macular degeneration; POAG primary open angle glaucoma; EBMD epithelial/anterior basement membrane dystrophy; ACIOL anterior chamber intraocular lens; IOL intraocular lens; PCIOL posterior chamber intraocular lens; Phaco/IOL phacoemulsification with intraocular lens placement; [place] photorefractive keratectomy; LASIK laser assisted in situ keratomileusis; HTN hypertension; DM diabetes mellitus; COPD chronic obstructive pulmonary disease

## 2021-11-19 NOTE — Assessment & Plan Note (Addendum)
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

## 2021-11-19 NOTE — Assessment & Plan Note (Signed)
technically resolved OS yet still likely active with a microscopic fluorescein angiographic

## 2021-11-19 NOTE — Assessment & Plan Note (Signed)
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

## 2021-11-19 NOTE — Assessment & Plan Note (Signed)
Patient continues on nightly CPAP use, preventing nightly hypoxic and hypertensive stress to the macula, nerve)

## 2021-11-24 DIAGNOSIS — H35352 Cystoid macular degeneration, left eye: Secondary | ICD-10-CM | POA: Diagnosis not present

## 2021-11-24 DIAGNOSIS — G4730 Sleep apnea, unspecified: Secondary | ICD-10-CM | POA: Diagnosis not present

## 2021-11-24 DIAGNOSIS — E10319 Type 1 diabetes mellitus with unspecified diabetic retinopathy without macular edema: Secondary | ICD-10-CM | POA: Diagnosis not present

## 2021-11-24 DIAGNOSIS — I10 Essential (primary) hypertension: Secondary | ICD-10-CM | POA: Diagnosis not present

## 2021-11-24 DIAGNOSIS — M8580 Other specified disorders of bone density and structure, unspecified site: Secondary | ICD-10-CM | POA: Diagnosis not present

## 2021-11-24 DIAGNOSIS — K5730 Diverticulosis of large intestine without perforation or abscess without bleeding: Secondary | ICD-10-CM | POA: Diagnosis not present

## 2021-11-24 DIAGNOSIS — I700 Atherosclerosis of aorta: Secondary | ICD-10-CM | POA: Diagnosis not present

## 2021-11-24 DIAGNOSIS — E668 Other obesity: Secondary | ICD-10-CM | POA: Diagnosis not present

## 2021-11-24 DIAGNOSIS — Z794 Long term (current) use of insulin: Secondary | ICD-10-CM | POA: Diagnosis not present

## 2021-11-24 DIAGNOSIS — E785 Hyperlipidemia, unspecified: Secondary | ICD-10-CM | POA: Diagnosis not present

## 2021-11-24 DIAGNOSIS — Z4681 Encounter for fitting and adjustment of insulin pump: Secondary | ICD-10-CM | POA: Diagnosis not present

## 2021-12-12 DIAGNOSIS — U071 COVID-19: Secondary | ICD-10-CM | POA: Diagnosis not present

## 2022-01-19 DIAGNOSIS — R2231 Localized swelling, mass and lump, right upper limb: Secondary | ICD-10-CM | POA: Diagnosis not present

## 2022-01-19 DIAGNOSIS — M79644 Pain in right finger(s): Secondary | ICD-10-CM | POA: Diagnosis not present

## 2022-01-19 DIAGNOSIS — M13841 Other specified arthritis, right hand: Secondary | ICD-10-CM | POA: Diagnosis not present

## 2022-01-19 DIAGNOSIS — R52 Pain, unspecified: Secondary | ICD-10-CM | POA: Diagnosis not present

## 2022-02-25 DIAGNOSIS — Z794 Long term (current) use of insulin: Secondary | ICD-10-CM | POA: Diagnosis not present

## 2022-02-25 DIAGNOSIS — E10319 Type 1 diabetes mellitus with unspecified diabetic retinopathy without macular edema: Secondary | ICD-10-CM | POA: Diagnosis not present

## 2022-02-25 DIAGNOSIS — I10 Essential (primary) hypertension: Secondary | ICD-10-CM | POA: Diagnosis not present

## 2022-02-25 DIAGNOSIS — E139 Other specified diabetes mellitus without complications: Secondary | ICD-10-CM | POA: Diagnosis not present

## 2022-02-25 DIAGNOSIS — Z4681 Encounter for fitting and adjustment of insulin pump: Secondary | ICD-10-CM | POA: Diagnosis not present

## 2022-02-25 DIAGNOSIS — E785 Hyperlipidemia, unspecified: Secondary | ICD-10-CM | POA: Diagnosis not present

## 2022-02-25 DIAGNOSIS — E668 Other obesity: Secondary | ICD-10-CM | POA: Diagnosis not present

## 2022-04-22 ENCOUNTER — Encounter: Payer: Self-pay | Admitting: Neurology

## 2022-04-22 ENCOUNTER — Ambulatory Visit (INDEPENDENT_AMBULATORY_CARE_PROVIDER_SITE_OTHER): Payer: Medicare Other | Admitting: Neurology

## 2022-04-22 VITALS — BP 155/75 | HR 69 | Ht 66.0 in | Wt 170.0 lb

## 2022-04-22 DIAGNOSIS — Z9641 Presence of insulin pump (external) (internal): Secondary | ICD-10-CM | POA: Diagnosis not present

## 2022-04-22 DIAGNOSIS — E113312 Type 2 diabetes mellitus with moderate nonproliferative diabetic retinopathy with macular edema, left eye: Secondary | ICD-10-CM

## 2022-04-22 DIAGNOSIS — R052 Subacute cough: Secondary | ICD-10-CM | POA: Diagnosis not present

## 2022-04-22 DIAGNOSIS — G4734 Idiopathic sleep related nonobstructive alveolar hypoventilation: Secondary | ICD-10-CM | POA: Diagnosis not present

## 2022-04-22 DIAGNOSIS — G4733 Obstructive sleep apnea (adult) (pediatric): Secondary | ICD-10-CM

## 2022-04-22 NOTE — Progress Notes (Signed)
SLEEP MEDICINE CLINIC    Provider:  Larey Seat, MD  Primary Care Physician:  [address], [place] [address]     Referring Provider: Dr Deloria Lair, MD  - Ophthalmology       Chief Complaint according to patient   Patient presents with:     New Patient (Initial Visit)     RV 10-22-2021      HISTORY OF PRESENT ILLNESS:  Kelly Glover is a 71 y.o.  Caucasian female patient seen here in a RV on 04/22/2022 : 04-22-2022:  The patient still reports less headaches, her eye condition improved as confirmed by Dr. Zadie Rhine. She reports a frequent coughing, and she developed a cough in the last 3 months, COVID testing repeatedly was negative. Has a mast cell disorder- autoimmune challenge.  Today's compliance download for 11 hours shows that she has used the machine from mid June to July for 40% of the time with an average of 5 hours and 23 minutes on days used, but the 90-day review shows a compliance of 46 out of 90 days 51% with an average of 5 hours and 36 minutes on days used.  She stated she traveled a lot and did not always take the machine with her.  She also has reported this cough that does bother her and it is hard to cough by wearing CPAP.  The mean pressure at the 95th percentile is between 8.7 and 8.5 cm water and the average AHI is between 2.2 and 2.7/h.  No significant central apnea arising.  Average leak is low 4.7 L/min.  And was a little bit higher just the last 30 days.   Medication has not drastically changed, the Epworth sleepiness score was endorsed at 4/ 24  points, the fatigue severity scale was not requested today.          2022:  Patient has used her new CPAP with some effort but also with success, and she made an interesting discovery- she used a pulse-oximetry device and fell asleep without CPAP in place,then put CPAP on at 1.45 AM and the oxygen levels were clearly better and less variable , her pulse rate was slower.  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 The Compliance record is excellent. The  couple has been on a Cruise, a wedding in [place] and contracted Covid 19. She recovered well from flu like symptoms. Treated with Paxlovid- She would like to purchase a travel CPAP- Epworth 3 points !   Much improved.  LUNA machine - she assured me she used it 28 nights but this is not recorded: low compliance on this months record.  5-12 cm water range, 14/ 24 days- average use 4.30 minutes.  95% pressure of 8.3 cm water, which would be setting for her travel CPAP. Variable leaks- nasal mask, FFM- she wants to try a nasal pillow. Bella swift ear loops. Please order and fit.    Consult note 2022.  Kelly Glover is here today for a follow-up she underwent a split-night polysomnography study on December 04, 2020 which revealed apnea to be present this was an mild apnea category with an AHI of 15.8 and this to have much higher REM sleep AHI than non-REM sleep AHI.  Supine sleep also accentuated the apnea hypopnea index 25.9/h versus a nonsupine apnea-hypopnea index of 10.3.  There were prolonged periods of hypoxemia hypoxia at nadir was 69% saturation.  So the treatment  for this constellation is usually a positive airway pressure therapy the patient started on CPAP. Dr Zadie Rhine, ophthalmologist-the mean pressure usage is 7.1 cmH2O the average 95th percentile pressure is 9.1 cmH2O these are still mild pressures required to overcome this patient's apnea.  She has very minimal air leakage on average 2 minutes she has an average leak of 7.5 L/min that is mild as well and her residual apnea hypopnea index is now 2.4 which is a good resolution of sleep apnea.  She has used the machine 30 out of 30 days the total usage and date range was 5 hours 32 minutes on average this fulfills the criteria of compliance by her insurance company.  Ramp pressure is 4 cmH2O minimum pressure maintained is 5 and maximum pressure is 12  cmH2O.  She has access to oximetry and this documented resolution of hypoxemia. This will please dr Zadie Rhine as well  Epworth score: 2/24  No more morning headache ! ROS: 30 sec drop attack, looking pale. Had 2 of these- not sure of cause.         Referral from Dr Zadie Rhine, concerned about hypoxemia at night , measured by ring, and cystoid macular edema. .  Chief concern according to patient :  Rm 11 with husband, Dr. Harrington Challenger.  Pt reports she is here to discuss sleep study per Dr. 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 She denies prior sleep study and reports she does snore at night.    I have the pleasure of seeing Kelly Glover today, a right -handed Caucasian female with a possible sleep disorder.  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     Sleep relevant medical history: Nocturia once at 4.30 AM. Family medical /sleep history: 2 sisters  suspected to have OSA, no insomnia, no sleep walkers.    Social history:  adult children , some out of state. Husband is MD> Patient is retired, homemaker, and lives in a household with 2 persons, 3 grandchildren visit often.  Tobacco use; never.  ETOH use ; none ,  Caffeine intake in form of Coffee( all day, 2 cups  In AM one at lunch).         Sleep habits are as follows: The patient's dinner time is between 5-7 PM. The patient goes to bed at 10.30 PM and continues to sleep for 5 hours, wakes for one bathroom breaks.  She likes the bedroom cool, quiet and dark.   The preferred sleep position is side , with the support of 2 pillows.  Dreams are reportedly infrequent/vivid.  7 AM is the usual rise time. The patient wakes  up spontaneously.   She reports  feeling refreshed and restored in AM, lately she had headaches in AM with symptoms such as dry mouth, morning headaches. No Naps are taken.Given the documented hypoxemia I suspect that the patient will have a REM dependent sleep apnea.  The longest periods of oxygen desaturation all of her in the early morning hours and are not related to her usual bathroom break time.  Follow while there is some motion artifact around 430 there are prolonged hypoxemia.  Already at 230 and also is late at 6 AM.  And this  has been repeatedly demonstrated throughout the night.  Her pulse range does not respond as nimble to these oxygen desaturations.  She has been taking metoprolol as needed and not on a regular basis and this could affect the response of this of her heart rate.  The ring will not allow me to see if she has arrhythmia only a heart rate variability can be demonstrated and her pulse rate at the highest was only 78 bpm.  The lowest oxygen saturation at nadir 77%.  I agree with Dr. Zadie Rhine that an evaluation and treatment for sleep apnea should be performed given that this may reverse some of the macular edema especially in the left eye.     Review of Systems: Out of a complete 14 system review, the patient complains of only the following symptoms, and all other reviewed systems are negative.:   loud snoring, controlled on CPAP. Improved sleep   How likely are you to doze in the following situations: 0 = not likely, 1 = slight chance, 2 = moderate chance, 3 = high chance   Sitting and Reading? Watching Television? Sitting inactive in a public place (theater or meeting)? As a passenger in a car for an hour without a break? Lying down in the afternoon when circumstances permit? Sitting and talking to someone? Sitting quietly after lunch without alcohol? In a car, while stopped for a few minutes in traffic?   Total = 4/ 24 points - unchanged   FSS endorsed at 33/ 63 points.   Up from 12 points last visit.   Coughing.    Social History   Socioeconomic History   Marital status: Married    Spouse name: Not on file   Number of children: Not on file   Years of education: Not on file   Highest education level: Not on file  Occupational History   Not on file  Tobacco Use   Smoking status: Never   Smokeless tobacco: Never  Substance and Sexual Activity   Alcohol use: No    Alcohol/week: 0.0 standard drinks of alcohol   Drug use: No   Sexual activity: Yes    Partners: Male    Birth control/protection: Surgical    Comment: hysterectomy  Other Topics Concern   Not on file  Social History Narrative   Not on file   Social Determinants of Health   Financial Resource Strain: Not on file  Food Insecurity: Not on file  Transportation Needs: Not on file  Physical Activity: Not on file  Stress: Not on file  Social Connections: Not on file    Family History  Problem Relation Age of Onset   Diabetes Mother    Heart disease Mother    Heart disease Father    Thyroid disease Sister    Asthma Maternal Grandmother 52   Diabetes Maternal Grandmother    Breast cancer Maternal Grandmother    Diabetes Maternal Alanda Slim' disease Son    Thyroid disease Son     Past Medical History:  Diagnosis Date   Diabetes ([place])    History of diverticulitis    History of kidney stones    Mast cell disorder    inappropriate mast cell activation   Neuromuscular disorder ([facility])    inappropriate mast cell response disorder    PONV (postoperative nausea and vomiting)    Squamous cell carcinoma of left lower leg 2015    Past Surgical History:  Procedure Laterality Date   BREAST BIOPSY Left ?  benign   DIAGNOSTIC LAPAROSCOPY     RECTOCELE REPAIR N/A 09/17/2014   Procedure: POSTERIOR REPAIR , enterocele repair;  Surgeon: Jamey Reas de Berton Lan, MD;  Location: [facility];  Service: Gynecology;  Laterality: N/A;   TONSILLECTOMY  childhood    TOTAL ABDOMINAL HYSTERECTOMY W/ BILATERAL SALPINGOOPHORECTOMY  05/20/98     Current Outpatient Medications on File Prior to Visit  Medication Sig Dispense Refill   aspirin EC 81 MG tablet Take 81 mg by mouth daily.     cetirizine (ZYRTEC) 10 MG tablet Take 10 mg by mouth daily.     Cholecalciferol (VITAMIN D) 2000 UNITS CAPS Take 1 capsule by mouth daily.     ezetimibe (ZETIA) 10 MG tablet Take 10 mg by mouth daily.  0   famotidine (PEPCID) 10 MG tablet Chew 10 mg by mouth daily.      FOLIC ACID PO Take 408 mcg by mouth daily.      Ibuprofen-Diphenhydramine Cit (ADVIL PM PO) Take 0.5 tablets by mouth daily as needed (sleep).     JARDIANCE 25 MG TABS tablet Take 25 mg by mouth daily.     NOVOLOG 100 UNIT/ML injection Inject into the skin as directed. Insulin pump     omeprazole-sodium bicarbonate (ZEGERID) 40-1100 MG per capsule Take 1 capsule by mouth daily before breakfast.     ONE TOUCH ULTRA TEST test strip      ramipril (ALTACE) 2.5 MG capsule Take 1 capsule by mouth daily.     rosuvastatin (CRESTOR) 40 MG tablet Take 40 mg by mouth daily.      sertraline (ZOLOFT) 100 MG tablet Take 50 mg by mouth daily.      No current facility-administered medications on file prior to visit.    Allergies  Allergen Reactions   Diflucan [Fluconazole]     Itching, palms red, throat itchy.    Scallops [Shellfish Allergy] Itching    Redness, throat constriction   Percocet [Oxycodone-Acetaminophen] Other (See Comments)    Diaphoresis, nausea, confusion.    Doxycycline Itching and Rash    Itchy throat   Griseofulvin Rash    Physical exam:  Today's Vitals   04/22/22 0914  BP: (!) 155/75  Pulse: 69  Weight: 170 lb (77.1 kg)  Height: 5' 6"  (1.676 m)   Body mass index is 27.44 kg/m.   Wt Readings from Last 3 Encounters:  04/22/22 170 lb (77.1 kg)  04/23/21 175 lb (79.4 kg)  11/07/20 175 lb (79.4 kg)     Ht Readings from Last 3 Encounters:  04/22/22 5' 6"  (1.676 m)  10/22/21 5' 6"   (1.676 m)  04/23/21 5' 6"  (1.676 m)      General: The patient is awake, alert and appears not in acute distress. The patient is well groomed. Head: Normocephalic, atraumatic. Neck is supple. Mallampati 1-2 neck circumference: 16 inches . Nasal airflow patent.  Retrognathia is not seen.  Dental status:  Cardiovascular:  Regular rate and cardiac rhythm by pulse,  without distended neck veins. Respiratory: Lungs are clear to auscultation.  Skin:  Without evidence of ankle edema, or rash. Trunk: The patient's posture is erect.   Neurologic exam : The patient is awake and alert, oriented to place and time.   Memory subjective described as intact.  Attention span & concentration ability appears normal.  Speech is fluent,  without  dysarthria, dysphonia or aphasia.  Mood and affect are appropriate.   Cranial nerves: no loss of smell or taste reported  Pupils are equal and briskly reactive to light. Visual fields by finger perimetry are intact. Hearing was intact to soft voice and finger rubbing.    Facial motor strength is symmetric and tongue and uvula move midline.  Neck ROM : rotation, tilt and flexion extension were normal for age and shoulder shrug was symmetrical.    Motor exam:  Symmetric bulk, tone and ROM.   Normal tone without cog -wheeling, symmetric grip strength- weakness. Weakness of thumb adduction and pinch strength- right dominant.    Sensory:  temperature, and vibration were tested - she felt no vibration on the forefoot, but could feel coolness an pressure.   Proprioception tested in the upper extremities was normal.         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 No nocturia. Rare morning headaches.  2) coughing over the last 3 months, poor compliance with CPAP this summer.    My Plan is to proceed with:  1)  improve compliance,  clean CPAP and replace all filters an tubing.Gets 95% pressure of 8.3 cm water, which would be setting for her travel CPAP, she has not purchased one yet. . Variable leaks- nasal mask, FFM- she wants to try a nasal pillow. Bella swift ear loops. Please order and fit.   continuously using CPAP- awaiting Dr Dahlia Bailiff report on retinal health. She will see him tomorrow.   I would like to thank Deloria Lair, MD  for allowing me to meet with and to take care of this pleasant patient.  I plan to follow up either personally within 12 months.     CC: I will share my notes with Dr Forde Dandy, MD   Electronically signed by: Larey Seat, MD 04/22/2022 9:48 AM  [facility] Board certified by The [employer] of Sleep Medicine and Diplomate of the [employer] of Sleep Medicine. Board certified In Neurology through the [place], Fellow of the [employer] of Neurology. Medical Director of [facility].

## 2022-04-22 NOTE — Patient Instructions (Signed)
KEEPING IT CLEAN: CPAP HYGIENE PROPER UPKEEP OF YOUR CPAP MACHINE CAN HELP ENSURE THE DEVICE FUNCTIONS PROPERLY CPAP CLEANING INSTRUCTIONS Along with proper CPAP cleaning it is recommended that you replace your mask, tubing and filters once very 3 months and more frequently if you are sick.   DAILY CLEANING Do not use moisturizing soaps, bleach, scented oils, chlorine, or alcohol-based solutions to clean your supplies. These solutions may cause irritation to your skin and lungs and may reduce the life of your products. Dawn [employer] or Comparable works best for daily cleaning.  **If you've been sick, it's smart to wash your mask, tubing, humidifier and filter daily until your cold, flu or virus symptoms are gone. That can help reduce the amount of time you spend under the weather.  Before using your mask -wash your face daily with soap and water to remove excess facial oils. Wipe down your mask (including areas that come in contact with your skin) using a damp towel with soap and warm water. This will remove any oils, dead skin cells, and sweat on the mask that can affect the quality of the seal. Gently rinse with a clean towel and let the mask air-dry out of direct sunlight. You can also use unscented baby wipes or pre-moistened towels designed specifically for cleaning CPAP masks, which are available on-line. DO NOT USE CLOROX OR DISINFECTING WIPES. If your unit has a humidifier, empty any leftover water instead of letting in sit in the unit all day. Refill the humidifier with clean, distilled water right before bedtime for optimal use  WEEKLY (OR MORE FREQUENT) CLEANING Your mask and tubing need a full bath at least once a week to keep it free of dust, bacteria, and germs. (During COVID-19 or any other flu/virus we recommend more frequent cleaning) Clean the CPAP tubing, nasal mask, and headgear in a bathroom sink filled with warm water and a few drops of ammonia-free, mild dish detergent. Avoid  using stronger cleaning products, as they may damage the mask or leave harmful residue. Swirl all parts around for about five minutes, rinse well and let air dry during the day. Hang the tubing over the shower rod, on a towel rack or in the laundry room to ensure all the water drips out. The mask and headgear can be air-dried on a towel or hung on a hook or hanger. You should also wipe down your CPAP machine with a damp cloth. Ensure the unit is unplugged. The towel shouldn't be too damp or wet, as water could get into the machine. Clean the filter by removing it and rinsing it in warm tap water. Run it under the water and squeeze to make sure there is no dust. Then blot down the filter with a towel. Do not wash your machine's white filter, if one is present--those are disposable and should be replaced every two weeks. If you are recovering from being sick, we recommend changing the filter sooner. If your CPAP has a humidifier, that also needs to be cleaned weekly. Empty any remaining water and then wash the water chamber in the sink with warm soapy water. Rinse well and drain out as much of the water as possible. Let the chamber air-dry before placing it back into the CPAP unit. Every other week you should disinfect the humidifier. Do that by soaking it in a solution of one-part vinegar to five parts water for 30 minutes, thoroughly rinsing and then placing in your dishwasher's top rack for washing. And  keep it clean by using only distilled water to prevent mineral deposits that can build up and cause damage to your machine.  IMPORTANT TIPS Make caring for your CPAP equipment part of your morning routine. Keep machine and accessories out of direct sunlight to avoid damaging them. Never use bleach to clean accessories. Place machine on a level surface and away from curtains that may interfere with the air intake. Keep track of when you should order replacement parts for your mask and accessories so that  you always get the most out of your CPAP. You can also sign up for Auto Supply by contacting our DME department at [email or web address]  The following are examples of soap that may be used: [employer], [place] soap (plain).  With a little upkeep, your CPAP can continue to help you breathe better for a long time. Just a few minutes a day can help keep your CPAP running efficiently for years to come.  If you have a CPAP, but are struggling with compliance, check out our no mask oral appliance, for those with mild to moderate sleep apnea.  Call and schedule a consultation with one of our sleep medicine physicians, or ask your doctor about a sleep referral to the [facility].

## 2022-04-22 NOTE — Progress Notes (Signed)
CM sent to [facility] for new order ?

## 2022-05-13 ENCOUNTER — Telehealth: Payer: Self-pay | Admitting: Internal Medicine

## 2022-05-13 NOTE — Telephone Encounter (Signed)
[place] for appt

## 2022-05-13 NOTE — Telephone Encounter (Signed)
Good morning Dr. Hilarie Fredrickson,  PT is requesting transfer of car. She was a patient of Dr. Earlean Shawl but he has retired. She has been experiencing abdominal pain and Per request Mrs. Vasco is requesting you as her doctor. No referral was given and she has records for review on Epic. Please review and advise of scheduling. Thank you.

## 2022-05-14 ENCOUNTER — Encounter: Payer: Self-pay | Admitting: Internal Medicine

## 2022-05-14 NOTE — Telephone Encounter (Signed)
Called PT back to schedule. Appointment is 10/17

## 2022-05-21 DIAGNOSIS — K5730 Diverticulosis of large intestine without perforation or abscess without bleeding: Secondary | ICD-10-CM | POA: Diagnosis not present

## 2022-05-21 DIAGNOSIS — I10 Essential (primary) hypertension: Secondary | ICD-10-CM | POA: Diagnosis not present

## 2022-05-21 DIAGNOSIS — R7989 Other specified abnormal findings of blood chemistry: Secondary | ICD-10-CM | POA: Diagnosis not present

## 2022-05-21 DIAGNOSIS — Z0000 Encounter for general adult medical examination without abnormal findings: Secondary | ICD-10-CM | POA: Diagnosis not present

## 2022-05-21 DIAGNOSIS — E10319 Type 1 diabetes mellitus with unspecified diabetic retinopathy without macular edema: Secondary | ICD-10-CM | POA: Diagnosis not present

## 2022-05-21 DIAGNOSIS — E785 Hyperlipidemia, unspecified: Secondary | ICD-10-CM | POA: Diagnosis not present

## 2022-05-21 DIAGNOSIS — M8580 Other specified disorders of bone density and structure, unspecified site: Secondary | ICD-10-CM | POA: Diagnosis not present

## 2022-05-29 DIAGNOSIS — Z1331 Encounter for screening for depression: Secondary | ICD-10-CM | POA: Diagnosis not present

## 2022-05-29 DIAGNOSIS — Z1389 Encounter for screening for other disorder: Secondary | ICD-10-CM | POA: Diagnosis not present

## 2022-05-29 DIAGNOSIS — G4730 Sleep apnea, unspecified: Secondary | ICD-10-CM | POA: Diagnosis not present

## 2022-05-29 DIAGNOSIS — E668 Other obesity: Secondary | ICD-10-CM | POA: Diagnosis not present

## 2022-05-29 DIAGNOSIS — E785 Hyperlipidemia, unspecified: Secondary | ICD-10-CM | POA: Diagnosis not present

## 2022-05-29 DIAGNOSIS — Z4681 Encounter for fitting and adjustment of insulin pump: Secondary | ICD-10-CM | POA: Diagnosis not present

## 2022-05-29 DIAGNOSIS — E139 Other specified diabetes mellitus without complications: Secondary | ICD-10-CM | POA: Diagnosis not present

## 2022-05-29 DIAGNOSIS — Z0000 Encounter for general adult medical examination without abnormal findings: Secondary | ICD-10-CM | POA: Diagnosis not present

## 2022-05-29 DIAGNOSIS — I10 Essential (primary) hypertension: Secondary | ICD-10-CM | POA: Diagnosis not present

## 2022-05-29 DIAGNOSIS — M8580 Other specified disorders of bone density and structure, unspecified site: Secondary | ICD-10-CM | POA: Diagnosis not present

## 2022-05-29 DIAGNOSIS — Z794 Long term (current) use of insulin: Secondary | ICD-10-CM | POA: Diagnosis not present

## 2022-05-29 DIAGNOSIS — R82998 Other abnormal findings in urine: Secondary | ICD-10-CM | POA: Diagnosis not present

## 2022-05-29 DIAGNOSIS — E10319 Type 1 diabetes mellitus with unspecified diabetic retinopathy without macular edema: Secondary | ICD-10-CM | POA: Diagnosis not present

## 2022-06-10 ENCOUNTER — Encounter: Payer: Self-pay | Admitting: Internal Medicine

## 2022-06-10 ENCOUNTER — Ambulatory Visit: Payer: Medicare Other | Attending: Internal Medicine | Admitting: Internal Medicine

## 2022-06-10 VITALS — BP 138/84 | HR 72 | Ht 66.0 in | Wt 182.8 lb

## 2022-06-10 DIAGNOSIS — E785 Hyperlipidemia, unspecified: Secondary | ICD-10-CM | POA: Diagnosis not present

## 2022-06-10 DIAGNOSIS — E559 Vitamin D deficiency, unspecified: Secondary | ICD-10-CM | POA: Diagnosis not present

## 2022-06-10 DIAGNOSIS — R7989 Other specified abnormal findings of blood chemistry: Secondary | ICD-10-CM | POA: Insufficient documentation

## 2022-06-10 MED ORDER — VITAMIN D 50 MCG (2000 UT) PO CAPS: 2.0000 | ORAL_CAPSULE | Freq: Every day | ORAL | Status: AC

## 2022-06-10 NOTE — Patient Instructions (Signed)
Medication Instructions:  Increase Vit D to 4,000 u per day  *If you need a refill on your cardiac medications before your next appointment, please call your pharmacy*   Lab Work: VIT D and NMR with Dr Forde Dandy  If you have labs (blood work) drawn today and your tests are completely normal, you will receive your results only by: [facility] (if you have MyChart) OR A paper copy in the mail If you have any lab test that is abnormal or we need to change your treatment, we will call you to review the results.   Testing/Procedures:    Follow-Up: At [facility], you and your health needs are our priority.  As part of our continuing mission to provide you with exceptional heart care, we have created designated Provider Care Teams.  These Care Teams include your primary Cardiologist (physician) and Advanced Practice Providers (APPs -  Physician Assistants and Nurse Practitioners) who all work together to provide you with the care you need, when you need it.  We recommend signing up for the patient portal called "MyChart".  Sign up information is provided on this After Visit Summary.  MyChart is used to connect with patients for Virtual Visits (Telemedicine).  Patients are able to view lab/test results, encounter notes, upcoming appointments, etc.  Non-urgent messages can be sent to your provider as well.   To learn more about what you can do with MyChart, go to [email or web address].    Your next appointment:   1 year(s)  The format for your next appointment:   In Person  Provider:   Dr Dorris Carnes    Other [place]

## 2022-06-10 NOTE — Progress Notes (Signed)
Cardiology Office Note   Date:  06/10/2022   ID:  Kelly, Glover 07/04/1951, MRN [id number]  PCP:  Reynold Bowen, MD  Cardiologist:   Dorris Carnes, MD    F/U of HL and atherosclerosis     History of Present Illness: Kelly Glover is a 71 y.o. female with a history of Type I DM, atherosclerosis of aorta, HL, dizziness, palpitations.  Myoview in past was negative for ischemia     I last saw the pt in clinic in 2018  Since seen she says she is active   Denies CP  Breathing is OK Occsaionally has "spells"  Will get a little weak feeling for a few seconds   Denies syncope   She has had for 20 to 25years   Might happen in middle of night      Follows with G Rankin for nonprolif diabetic retinopathy.   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    Went on to finding out she has sleep apnea  Using CPAP now   The eye changes have improved some   She says she has about 10 low sugars per week  takes juice for  Walks some   Works in garden       Current Meds  Medication Sig   aspirin EC 81 MG tablet Take 81 mg by mouth daily.   cetirizine (ZYRTEC) 10 MG tablet Take 10 mg by mouth daily.   Cholecalciferol (VITAMIN D) 2000 UNITS CAPS Take 1 capsule by mouth daily.   ezetimibe (ZETIA) 10 MG tablet Take 10 mg by mouth daily.   famotidine (PEPCID) 10 MG tablet Chew 10 mg by mouth daily.    FOLIC ACID PO Take 409 mcg by mouth daily.    Ibuprofen-Diphenhydramine Cit (ADVIL PM PO) Take 0.5 tablets by mouth daily as needed (sleep).   JARDIANCE 25 MG TABS tablet Take 25 mg by mouth daily.   NOVOLOG 100 UNIT/ML injection Inject into the skin as directed. Insulin pump   omeprazole-sodium bicarbonate (ZEGERID) 40-1100 MG per capsule Take 1 capsule by mouth daily before breakfast.   ONE TOUCH ULTRA TEST test strip    ramipril (ALTACE) 2.5 MG capsule Take 1 capsule by mouth daily.   rosuvastatin (CRESTOR) 40 MG tablet Take 40 mg by mouth daily.    sertraline (ZOLOFT) 100 MG tablet  Take 50 mg by mouth daily.      Allergies:   Diflucan [fluconazole], Scallops [shellfish allergy], Percocet [oxycodone-acetaminophen], Doxycycline, and Griseofulvin   Past Medical History:  Diagnosis Date   Colon polyps    Diabetes ([place])    Diverticulitis    DM (diabetes mellitus) ([place])    GERD (gastroesophageal reflux disease)    Hepatic steatosis    History of diverticulitis    History of kidney stones    Hyperlipidemia    Mast cell disorder    inappropriate mast cell activation   Neuromuscular disorder ([facility])    inappropriate mast cell response disorder    PONV (postoperative nausea and vomiting)    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   benign   CATARACTS     DIAGNOSTIC LAPAROSCOPY     RECTOCELE REPAIR N/A 09/17/2014   Procedure: POSTERIOR REPAIR , enterocele repair;  Surgeon: Jamey Reas de Berton Lan, MD;  Location: [facility];  Service: Gynecology;  Laterality: N/A;   TONSILLECTOMY  childhood   TOTAL ABDOMINAL HYSTERECTOMY W/ BILATERAL SALPINGOOPHORECTOMY  05/20/1998     Social History:  The patient  reports that she has never smoked. She has never used smokeless tobacco. She reports that she does not drink alcohol and does not use drugs.   Family History:  The patient's family history includes Asthma (age of onset: 97) in her maternal grandmother; Breast cancer in her maternal grandmother; Diabetes in her maternal grandfather, maternal grandmother, and mother; Berenice Primas' disease in her son; Heart disease in her father and mother; Thyroid disease in her sister and son.    ROS:  Please see the history of present illness. All other systems are reviewed and  Negative to the above problem except as noted.    PHYSICAL EXAM: VS:  BP 138/84   Pulse 72   Ht 5' 6"  (1.676 m)   Wt 182 lb 12.8 oz (82.9 kg)   SpO2 96%   BMI 29.50 kg/m   GEN: Overweight 71  yo  in no acute distress  HEENT: normal  Neck: no JVD, carotid bruits, Cardiac: RRR; no murmurs, rNo LE edema  Respiratory:  clear to auscultation bilaterally, GI: soft, nontender, nondistended, + BS  No hepatomegaly  MS: no deformity Moving all extremities   Skin: warm and dry, no rash Neuro:  Strength and sensation are intact Psych: euthymic mood, full affect   EKG:  EKG is ordered today.  SR 72 bpm   LVH by voltage    Lipid Panel No results found for: "CHOL", "TRIG", "HDL", "CHOLHDL", "VLDL", "LDLCALC", "LDLDIRECT"    Wt Readings from Last 3 Encounters:  06/10/22 182 lb 12.8 oz (82.9 kg)  04/22/22 170 lb (77.1 kg)  04/23/21 175 lb (79.4 kg)      ASSESSMENT AND PLAN:  1 HL   LDL 65  HDL 57   Trig 100  Keep on same meds   2  Aortic atherosclerosis     Continue risk factor modification  3 Hx palpitations    Denies any significant episodes/trends  Monitor in past unrevealing    4 "spells"  Not clear what these are    I have reviewed my notes from the past   Follow    5  CP  Denies  6.  DM   [facility]s with [facility]  7  HCM   Increase Vit D  Goal 50-100    Increase walking      F/U in 1 year   Sooner for problems      Current medicines are reviewed at length with the patient today.  The patient does not have concerns regarding medicines.  Signed, Dorris Carnes, MD  06/10/2022 3:13 PM    [facility] [place], [place], [place]  [address] Phone: [phone]; Fax: [phone]

## 2022-06-16 NOTE — Addendum Note (Signed)
Addended by: Janan Halter F on: 06/16/2022 04:01 PM   Modules accepted: Orders

## 2022-06-17 ENCOUNTER — Encounter: Payer: Self-pay | Admitting: Internal Medicine

## 2022-06-25 DIAGNOSIS — L570 Actinic keratosis: Secondary | ICD-10-CM | POA: Diagnosis not present

## 2022-06-25 DIAGNOSIS — L820 Inflamed seborrheic keratosis: Secondary | ICD-10-CM | POA: Diagnosis not present

## 2022-06-25 DIAGNOSIS — L821 Other seborrheic keratosis: Secondary | ICD-10-CM | POA: Diagnosis not present

## 2022-06-25 DIAGNOSIS — D485 Neoplasm of uncertain behavior of skin: Secondary | ICD-10-CM | POA: Diagnosis not present

## 2022-07-06 ENCOUNTER — Encounter: Payer: Self-pay | Admitting: *Deleted

## 2022-07-08 DIAGNOSIS — Z23 Encounter for immunization: Secondary | ICD-10-CM | POA: Diagnosis not present

## 2022-07-15 DIAGNOSIS — H35352 Cystoid macular degeneration, left eye: Secondary | ICD-10-CM | POA: Diagnosis not present

## 2022-07-15 DIAGNOSIS — E119 Type 2 diabetes mellitus without complications: Secondary | ICD-10-CM | POA: Diagnosis not present

## 2022-07-15 DIAGNOSIS — H5201 Hypermetropia, right eye: Secondary | ICD-10-CM | POA: Diagnosis not present

## 2022-07-21 ENCOUNTER — Ambulatory Visit (INDEPENDENT_AMBULATORY_CARE_PROVIDER_SITE_OTHER): Payer: Medicare Other | Admitting: Internal Medicine

## 2022-07-21 ENCOUNTER — Encounter: Payer: Self-pay | Admitting: Internal Medicine

## 2022-07-21 VITALS — BP 136/78 | HR 77 | Ht 66.0 in

## 2022-07-21 DIAGNOSIS — K219 Gastro-esophageal reflux disease without esophagitis: Secondary | ICD-10-CM

## 2022-07-21 DIAGNOSIS — R1032 Left lower quadrant pain: Secondary | ICD-10-CM

## 2022-07-21 DIAGNOSIS — K50119 Crohn's disease of large intestine with unspecified complications: Secondary | ICD-10-CM

## 2022-07-21 DIAGNOSIS — Z83719 Family history of colon polyps, unspecified: Secondary | ICD-10-CM | POA: Diagnosis not present

## 2022-07-21 DIAGNOSIS — K5732 Diverticulitis of large intestine without perforation or abscess without bleeding: Secondary | ICD-10-CM

## 2022-07-21 MED ORDER — AMOXICILLIN-POT CLAVULANATE 875-125 MG PO TABS: 1.0000 | ORAL_TABLET | Freq: Two times a day (BID) | ORAL | 0 refills | Status: DC

## 2022-07-21 MED ORDER — MESALAMINE 1.2 G PO TBEC: 2.4000 g | DELAYED_RELEASE_TABLET | Freq: Every day | ORAL | 6 refills | Status: DC

## 2022-07-21 NOTE — Progress Notes (Signed)
Patient ID: Kelly Glover, female   DOB: [birth year], 71 y.o.   MRN: [id number] HPI: Kelly Glover is a 71 year old female with a history of recurrent colonic diverticulitis, history of constipation, GERD, mast cell activation disorder, type 1 diabetes on insulin pump, sleep apnea who is seen to establish care after Dr. Earlean Shawl retired.  She is here alone today.  She reports that she has a history of colonic diverticulitis and has been hospitalized 2 times over a 5-year.  For the same.  She has Augmentin on hand for which she will use when she develops left lower quadrant pain which does not improve after a day or so.  She reports she often has some left lower quadrant discomfort which seems to get better if she puts pressure on the left lower abdomen.  Some bloating symptom.  No blood in stool or melena.  Bowel movements are typically regular about once per day.  She has had constipation in the past.  During diverticulitis flares stool gets thinner than usual.  She reports that she always has Augmentin on hand and if she has worsening left lower quadrant pain or pain that she notices while riding in the car she will typically take Augmentin.  She may have taken this a little more liberally since her last hospitalization to try to avoid getting that sick.  She estimates taking a 7-day course of Augmentin 3-4 times per year.  She reports her last colonoscopy was about 3 years ago, "during [facility]".  This was performed by Dr. Earlean Shawl.  I do not have a copy of this today.  She denies a history of colon polyps.  Her dad had numerous colon polyps as does her sister.  She has longstanding reflux for which she uses Zegerid daily, Pepcid in the evening.  Currently symptoms well controlled.  She also takes Zyrtec which helps to control her mast cell activation disease.  No current dysphagia or odynophagia.  No nausea or vomiting.  She did recently start the Noom diet with her husband. Her husband is Dr. Gus Height,  OB/GYN  No tobacco use.  No alcohol use.  Past Medical History:  Diagnosis Date   Aortic atherosclerosis ([place])    Arthritis    Colon polyps    Diabetes ([place])    Diverticulitis    Diverticulosis    DM (diabetes mellitus) ([place])    GERD (gastroesophageal reflux disease)    Hepatic steatosis    History of diverticulitis    History of kidney stones    Hyperlipidemia    Kidney stone    Mast cell disorder    inappropriate mast cell activation   Nephrolithiasis    Neuromuscular disorder ([place])    inappropriate mast cell response disorder    Obesity    PONV (postoperative nausea and vomiting)    Sleep apnea    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   benign   CATARACTS     DIAGNOSTIC LAPAROSCOPY     RECTOCELE REPAIR N/A 09/17/2014   Procedure: POSTERIOR REPAIR , enterocele repair;  Surgeon: Jamey Reas de Berton Lan, MD;  Location: [facility];  Service: Gynecology;  Laterality: N/A;   TONSILLECTOMY  childhood   TOTAL ABDOMINAL HYSTERECTOMY W/ BILATERAL SALPINGOOPHORECTOMY  05/20/1998    Outpatient Medications Prior to Visit  Medication Sig Dispense Refill   aspirin EC  81 MG tablet Take 81 mg by mouth daily.     cetirizine (ZYRTEC) 10 MG tablet Take 10 mg by mouth daily.     Cholecalciferol (VITAMIN D) 50 MCG (2000 UT) CAPS Take 2 capsules (4,000 Units total) by mouth daily. 30 capsule    ezetimibe (ZETIA) 10 MG tablet Take 10 mg by mouth daily.  0   famotidine (PEPCID) 10 MG tablet Chew 10 mg by mouth daily.      FOLIC ACID PO Take 749 mcg by mouth daily.      Ibuprofen-Diphenhydramine Cit (ADVIL PM PO) Take 0.5 tablets by mouth daily as needed (sleep).     JARDIANCE 25 MG TABS tablet Take 25 mg by mouth daily.     omeprazole-sodium bicarbonate (ZEGERID) 40-1100 MG per capsule Take 1 capsule by mouth daily before breakfast.     ONE TOUCH ULTRA TEST test  strip      ramipril (ALTACE) 2.5 MG capsule Take 1 capsule by mouth daily. Pt take 10 mg a day     rosuvastatin (CRESTOR) 40 MG tablet Take 40 mg by mouth daily.      sertraline (ZOLOFT) 100 MG tablet Take 50 mg by mouth daily.      NOVOLOG 100 UNIT/ML injection Inject into the skin as directed. Insulin pump     No facility-administered medications prior to visit.    Allergies  Allergen Reactions   Diflucan [Fluconazole]     Itching, palms red, throat itchy.    Scallops [Shellfish Allergy] Itching    Redness, throat constriction   Percocet [Oxycodone-Acetaminophen] Other (See Comments)    Diaphoresis, nausea, confusion.    Doxycycline Itching and Rash    Itchy throat   Griseofulvin Rash    Family History  Problem Relation Age of Onset   Diabetes Mother    Heart disease Mother    Heart disease Father    Colon polyps Father    Thyroid disease Sister    Asthma Maternal Grandmother 83   Diabetes Maternal Grandmother    Breast cancer Maternal Grandmother    Diabetes Maternal Grandfather    Graves' disease Son    Thyroid disease Son    Stomach cancer Neg Hx    Esophageal cancer Neg Hx    Colon cancer Neg Hx     Social History   Tobacco Use   Smoking status: Never   Smokeless tobacco: Never  Vaping Use   Vaping Use: Never used  Substance Use Topics   Alcohol use: No    Alcohol/week: 0.0 standard drinks of alcohol   Drug use: No    ROS: As per history of present illness, otherwise negative  BP 136/78   Pulse 77   Ht 5' 6"  (1.676 m)   BMI 29.50 kg/m  Gen: awake, alert, NAD HEENT: anicteric CV: RRR, no mrg Abd: soft, tenderness in the left lower quadrant without rebound or guarding, nondistended, obese,  +BS throughout Ext: no c/c/e Neuro: nonfocal   RELEVANT LABS AND IMAGING: Blood work reviewed from Dr. Forde Dandy at [facility] dated August 2023 AST 24, ALT 38, bilirubin 0.6, alk phos 54 Creatinine 0.7 White count 7.2, hemoglobin 15.2, MCV  91.5, platelet count 199 Hemoglobin A1c 7.1 Vitamin D 30.9  Last cross-sectional imaging I am aware of was September 2018 which showed sigmoid diverticulitis at that time without complication.  ASSESSMENT/PLAN: 70 year old female with a history of recurrent colonic diverticulitis, history of constipation, GERD, mast cell activation disorder, type 1 diabetes on insulin  pump, sleep apnea who is seen to establish care after Dr. Earlean Shawl retired.   1.  Recurrent diverticulitis/left lower quadrant pain --we discussed this at length today and she is having intermittent left lower quadrant pain and she has a relatively low threshold to use Augmentin which she keeps on hand.  I certainly understand this given her history of hospitalization related to diverticulitis in the past.  That said I am not sure that every pain or discomfort she is feeling in the left lower quadrant is related to a true diverticulitis.  It raises the question of possibly segmental colitis associated with diverticulitis ([facility]).  We did discuss diet as it relates to diverticulosis and diverticulitis.  I would avoid popcorn but we have no scientific evidence that avoiding seeds and nuts makes a difference.  I think it is reasonable for her to keep Augmentin on hand but only use this if she has increasing pain over more than 48 hours, fever.  I have recommended the following: -- Obtain colonoscopy records from Dr. Earlean Shawl, I would like to review these to see if he notes any erythematous change relating to the diverticulosis.  I would also like to verify that her diverticulosis is confined to the sigmoid.  If so and should she have recurrent bouts we could consider segmental resection.  We discussed this today though I do not think this needs to be done at this time -- I am going to treat her with Lialda 2.4 g daily to see if this helps with the minor left lower quadrant pain that she has on a regular basis; she is asked to send me a MyChart  message about a month after starting this medication and let me know if it seems to be helping -- Augmentin 875 twice daily x7 days if recurrent diverticulitis -- Repeat colonoscopy to be based on symptoms and also screening/surveillance interval once records are reviewed  2.  Colon cancer screening/family history of colon polyps in father and sister --colonoscopy less than 5 years ago.  These records have been requested as above  3.  GERD --no history of Barrett's esophagus.  Symptoms currently well controlled on Zegerid in the morning and famotidine in the evening.  She will continue this therapy at this time  Follow-up with me in about 3 months, sooner if needed  XY:VOPFY, Annie Main, [place],  [place] [phone]

## 2022-07-21 NOTE — Patient Instructions (Signed)
_______________________________________________________  If you are age 72 or older, your body mass index should be between 23-30. Your Body mass index is 29.5 kg/m. If this is out of the aforementioned range listed, please consider follow up with your Primary Care Provider.  If you are age 7 or younger, your body mass index should be between 19-25. Your Body mass index is 29.5 kg/m. If this is out of the aformentioned range listed, please consider follow up with your Primary Care Provider.   ________________________________________________________  The [facility] providers would like to encourage you to use [facility] to communicate with providers for non-urgent requests or questions.  Due to long hold times on the telephone, sending your provider a message by [facility] may be a faster and more efficient way to get a response.  Please allow 48 business hours for a response.  Please remember that this is for non-urgent requests.  _______________________________________________________  We have sent the following medications to your pharmacy for you to pick up at your convenience:  Augmentin, Lialda  Please follow up in 3 months

## 2022-07-23 ENCOUNTER — Telehealth: Payer: Self-pay

## 2022-07-23 NOTE — Telephone Encounter (Signed)
Please let her know that I received records from Dr. Earlean Shawl and she is due surveillance colonoscopy on her around 09/07/2022.  For this reason I recommend surveillance colonoscopy in December 2023 or January 2024.  Her last colonoscopy with Dr. Earlean Shawl was 09/07/2017.  Thanks  Pt aware of Dr. Vena Rua recommendations. States she will call to schedule appt when she gets back in town.

## 2022-08-08 DIAGNOSIS — Z23 Encounter for immunization: Secondary | ICD-10-CM | POA: Diagnosis not present

## 2022-08-12 DIAGNOSIS — H35372 Puckering of macula, left eye: Secondary | ICD-10-CM | POA: Diagnosis not present

## 2022-08-12 DIAGNOSIS — E119 Type 2 diabetes mellitus without complications: Secondary | ICD-10-CM | POA: Diagnosis not present

## 2022-08-19 ENCOUNTER — Encounter (INDEPENDENT_AMBULATORY_CARE_PROVIDER_SITE_OTHER): Payer: Self-pay

## 2022-08-19 ENCOUNTER — Encounter (INDEPENDENT_AMBULATORY_CARE_PROVIDER_SITE_OTHER): Payer: Medicare Other | Admitting: Ophthalmology

## 2022-08-19 DIAGNOSIS — H43822 Vitreomacular adhesion, left eye: Secondary | ICD-10-CM | POA: Diagnosis not present

## 2022-08-19 DIAGNOSIS — Z794 Long term (current) use of insulin: Secondary | ICD-10-CM | POA: Diagnosis not present

## 2022-08-19 DIAGNOSIS — E113392 Type 2 diabetes mellitus with moderate nonproliferative diabetic retinopathy without macular edema, left eye: Secondary | ICD-10-CM | POA: Diagnosis not present

## 2022-08-19 DIAGNOSIS — E113391 Type 2 diabetes mellitus with moderate nonproliferative diabetic retinopathy without macular edema, right eye: Secondary | ICD-10-CM | POA: Diagnosis not present

## 2022-08-19 DIAGNOSIS — H35352 Cystoid macular degeneration, left eye: Secondary | ICD-10-CM | POA: Diagnosis not present

## 2022-08-19 DIAGNOSIS — G4733 Obstructive sleep apnea (adult) (pediatric): Secondary | ICD-10-CM | POA: Diagnosis not present

## 2022-08-19 DIAGNOSIS — H35072 Retinal telangiectasis, left eye: Secondary | ICD-10-CM | POA: Diagnosis not present

## 2022-09-01 DIAGNOSIS — Z794 Long term (current) use of insulin: Secondary | ICD-10-CM | POA: Diagnosis not present

## 2022-09-01 DIAGNOSIS — Z4681 Encounter for fitting and adjustment of insulin pump: Secondary | ICD-10-CM | POA: Diagnosis not present

## 2022-09-01 DIAGNOSIS — I10 Essential (primary) hypertension: Secondary | ICD-10-CM | POA: Diagnosis not present

## 2022-09-01 DIAGNOSIS — E785 Hyperlipidemia, unspecified: Secondary | ICD-10-CM | POA: Diagnosis not present

## 2022-09-01 DIAGNOSIS — E10319 Type 1 diabetes mellitus with unspecified diabetic retinopathy without macular edema: Secondary | ICD-10-CM | POA: Diagnosis not present

## 2022-09-01 DIAGNOSIS — E139 Other specified diabetes mellitus without complications: Secondary | ICD-10-CM | POA: Diagnosis not present

## 2022-09-25 ENCOUNTER — Telehealth: Payer: Self-pay

## 2022-09-25 NOTE — Telephone Encounter (Signed)
Kelly Glover, this patient is coming in for a colonoscopy on 1/18. She is on a insulin pump. Would you reach out to prescribing MD for instructions regarding this for colonoscopy.   Sammie Bench, Previsit

## 2022-09-25 NOTE — Telephone Encounter (Signed)
Insulin pump letter faxed electronically and manually to Dr Reynold Bowen at [phone]. Will await response.

## 2022-09-30 ENCOUNTER — Ambulatory Visit (AMBULATORY_SURGERY_CENTER): Payer: Medicare Other | Admitting: *Deleted

## 2022-09-30 VITALS — Ht 66.0 in | Wt 170.0 lb

## 2022-09-30 DIAGNOSIS — Z86010 Personal history of colonic polyps: Secondary | ICD-10-CM

## 2022-09-30 MED ORDER — NA SULFATE-K SULFATE-MG SULF 17.5-3.13-1.6 GM/177ML PO SOLN: 1.0000 | Freq: Once | ORAL | 0 refills | Status: AC

## 2022-09-30 NOTE — Progress Notes (Signed)

## 2022-10-06 NOTE — Telephone Encounter (Signed)
We have received a faxed note from Dr Forde Dandy stating, "Can stay on Basal rate. Has CGM in use. [facility]" (see original correspondence under "media" tab in EPIC)  I have contacted patient to advise her of Dr Baldwin Crown recommendation and she verbalizes understanding of this information.

## 2022-10-12 ENCOUNTER — Telehealth: Payer: Self-pay | Admitting: Internal Medicine

## 2022-10-12 ENCOUNTER — Ambulatory Visit (AMBULATORY_SURGERY_CENTER): Payer: Medicare Other | Admitting: Internal Medicine

## 2022-10-12 ENCOUNTER — Encounter: Payer: Self-pay | Admitting: Internal Medicine

## 2022-10-12 VITALS — BP 132/61 | HR 58 | Temp 97.8°F | Resp 19 | Ht 66.0 in | Wt 170.0 lb

## 2022-10-12 DIAGNOSIS — E119 Type 2 diabetes mellitus without complications: Secondary | ICD-10-CM | POA: Diagnosis not present

## 2022-10-12 DIAGNOSIS — Z09 Encounter for follow-up examination after completed treatment for conditions other than malignant neoplasm: Secondary | ICD-10-CM

## 2022-10-12 DIAGNOSIS — D125 Benign neoplasm of sigmoid colon: Secondary | ICD-10-CM | POA: Diagnosis not present

## 2022-10-12 DIAGNOSIS — D120 Benign neoplasm of cecum: Secondary | ICD-10-CM | POA: Diagnosis not present

## 2022-10-12 DIAGNOSIS — Z86010 Personal history of colonic polyps: Secondary | ICD-10-CM | POA: Diagnosis not present

## 2022-10-12 DIAGNOSIS — G4730 Sleep apnea, unspecified: Secondary | ICD-10-CM | POA: Diagnosis not present

## 2022-10-12 DIAGNOSIS — E669 Obesity, unspecified: Secondary | ICD-10-CM | POA: Diagnosis not present

## 2022-10-12 MED ORDER — SODIUM CHLORIDE 0.9 % IV SOLN: 500.0000 mL | Freq: Once | INTRAVENOUS | Status: DC

## 2022-10-12 NOTE — Progress Notes (Signed)
Called to room to assist during endoscopic procedure.  Patient ID and intended procedure confirmed with present staff. Received instructions for my participation in the procedure from the performing physician.  

## 2022-10-12 NOTE — Progress Notes (Signed)
Pt's states no medical or surgical changes since previsit or office visit.  1019 CBG 80. Pt states I'm shaky feeling and my sugar drops fast. IV Started and D5  hung.CRNA made aware.   1034 follow up CBG 96. Pt asymptomatic now.

## 2022-10-12 NOTE — Op Note (Signed)
Kelly Glover Patient Name: Kelly Glover Procedure Date: 10/12/2022 10:52 AM MRN: [id number] Endoscopist: Jerene Bears , MD, [id number] Age: 72 Referring MD:  Date of Birth: [birth year] Gender: Female Account #: [id number] Procedure:                Colonoscopy Indications:              Surveillance: Personal history of adenomatous                            polyps and family hx of colon polyps; last                            colonoscopy 5 years ago (Dr. Earlean Shawl) Medicines:                Monitored Anesthesia Care Procedure:                Pre-Anesthesia Assessment:                           - Prior to the procedure, a History and Physical                            was performed, and patient medications and                            allergies were reviewed. The patient's tolerance of                            previous anesthesia was also reviewed. The risks                            and benefits of the procedure and the sedation                            options and risks were discussed with the patient.                            All questions were answered, and informed consent                            was obtained. Prior Anticoagulants: The patient has                            taken no anticoagulant or antiplatelet agents. ASA                            Grade Assessment: III - A patient with severe                            systemic disease. After reviewing the risks and                            benefits, the patient was deemed in satisfactory  condition to undergo the procedure.                           After obtaining informed consent, the colonoscope                            was passed under direct vision. Throughout the                            procedure, the patient's blood pressure, pulse, and                            oxygen saturations were monitored continuously. The                            PCF-HQ190L Colonoscope was  introduced through the                            anus and advanced to the cecum, identified by                            appendiceal orifice and ileocecal valve. The                            colonoscopy was performed without difficulty. The                            patient tolerated the procedure well. The quality                            of the bowel preparation was good. The ileocecal                            valve, appendiceal orifice, and rectum were                            photographed. Scope In: 11:01:30 AM Scope Out: 11:17:03 AM Scope Withdrawal Time: 0 hours 11 minutes 24 seconds  Total Procedure Duration: 0 hours 15 minutes 33 seconds  Findings:                 The digital rectal exam was normal.                           A 4 mm polyp was found in the cecum. The polyp was                            sessile. The polyp was removed with a cold snare.                            Resection and retrieval were complete.                           A 5 mm polyp was found in the sigmoid colon. The  polyp was sessile. The polyp was removed with a                            cold snare. Resection and retrieval were complete.                           Multiple medium-mouthed and small-mouthed                            diverticula were found in the sigmoid colon.                           The retroflexed view of the distal rectum and anal                            verge was normal and showed no anal or rectal                            abnormalities. Complications:            No immediate complications. Estimated Blood Loss:     Estimated blood loss: none. Impression:               - One 4 mm polyp in the cecum, removed with a cold                            snare. Resected and retrieved.                           - One 5 mm polyp in the sigmoid colon, removed with                            a cold snare. Resected and retrieved.                            - Moderate diverticulosis in the sigmoid colon.                           - The distal rectum and anal verge are normal on                            retroflexion view. Recommendation:           - Patient has a contact number available for                            emergencies. The signs and symptoms of potential                            delayed complications were discussed with the                            patient. Return to normal activities tomorrow.  Written discharge instructions were provided to the                            patient.                           - Resume previous diet.                           - Continue present medications.                           - Await pathology results.                           - Repeat colonoscopy is recommended for                            surveillance. The colonoscopy date will be                            determined after pathology results from today's                            exam become available for review. Jerene Bears, MD 10/12/2022 11:20:20 AM This report has been signed electronically.

## 2022-10-12 NOTE — Progress Notes (Signed)
Vss nad trans to pacu °

## 2022-10-12 NOTE — Progress Notes (Signed)
GASTROENTEROLOGY PROCEDURE H&P NOTE   Primary Care Physician: Reynold Bowen, MD    Reason for Procedure:  History of colon polyps, family history of colon cancer, diverticulitis and left lower quadrant pain  Plan:    Colonoscopy  Patient is appropriate for endoscopic procedure(s) in the ambulatory ([facility]) setting.  The nature of the procedure, as well as the risks, benefits, and alternatives were carefully and thoroughly reviewed with the patient. Ample time for discussion and questions allowed. The patient understood, was satisfied, and agreed to proceed.     HPI: Kelly Glover is a 72 y.o. female who presents for colonoscopy.  Medical history as below.  Tolerated the prep.  No recent chest pain or shortness of breath.  No abdominal pain today.  Past Medical History:  Diagnosis Date   Allergy    SEASONAL   Aortic atherosclerosis (HCC)    Arthritis    Cataract    BILATERAL,REMOVED   Colon polyps    Diabetes ([facility])    Diverticulitis    Diverticulosis    DM (diabetes mellitus) ([place])    GERD (gastroesophageal reflux disease)    Hepatic steatosis    History of diverticulitis    History of kidney stones    Hyperlipidemia    Kidney stone    Mast cell disorder    inappropriate mast cell activation   Nephrolithiasis    Neuromuscular disorder (HCC)    inappropriate mast cell response disorder    Obesity    PONV (postoperative nausea and vomiting)    Sleep apnea    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   benign   CATARACTS     COLONOSCOPY  2018   DIAGNOSTIC LAPAROSCOPY     RECTOCELE REPAIR N/A 09/17/2014   Procedure: POSTERIOR REPAIR , enterocele repair;  Surgeon: Jamey Reas de Berton Lan, MD;  Location: [facility];  Service: Gynecology;  Laterality: N/A;   SALPINGOOPHORECTOMY     TONSILLECTOMY  childhood   TOTAL ABDOMINAL HYSTERECTOMY W/  BILATERAL SALPINGOOPHORECTOMY  05/20/1998    Prior to Admission medications   Medication Sig Start Date End Date Taking? Authorizing Provider  aspirin EC 81 MG tablet Take 81 mg by mouth daily.   Yes [provider]  cetirizine (ZYRTEC) 10 MG tablet Take 10 mg by mouth daily.   Yes [provider]  Cholecalciferol (VITAMIN D) 50 MCG (2000 UT) CAPS Take 2 capsules (4,000 Units total) by mouth daily. 06/10/22  Yes Fay Records, MD  ezetimibe (ZETIA) 10 MG tablet Take 10 mg by mouth daily. 04/19/17  Yes [provider]  famotidine (PEPCID) 10 MG tablet Chew 10 mg by mouth daily.    Yes [provider]  FOLIC ACID PO Take 416 mcg by mouth daily.    Yes [provider]  JARDIANCE 25 MG TABS tablet Take 25 mg by mouth daily. 05/12/17  Yes [provider]  omeprazole-sodium bicarbonate (ZEGERID) 40-1100 MG per capsule Take 1 capsule by mouth daily before breakfast.   Yes [provider]  ONE TOUCH ULTRA TEST test strip  06/24/14  Yes [provider]  ramipril (ALTACE) 2.5 MG capsule Take 1 capsule by mouth daily. Pt take 10 mg a day 06/27/14  Yes [provider]  rosuvastatin (CRESTOR) 40 MG tablet Take 40 mg by mouth daily.  06/27/14  Yes [provider]  sertraline (ZOLOFT) 100 MG tablet Take 50 mg by mouth daily.  06/24/14  Yes [provider]  EPINEPHrine (EPIPEN 2-PAK) 0.3 mg/0.3 mL IJ SOAJ injection as directed Injection as needed for severe allergic reaction for 5 days Patient not taking: Reported on 09/30/2022    [provider]  Glucagon (GVOKE HYPOPEN 2-PACK) 1 MG/0.2ML SOAJ as directed Subcutaneous for severe hypoglycemia    [provider]    Current Outpatient Medications  Medication Sig Dispense Refill   aspirin EC 81 MG tablet Take 81 mg by mouth daily.     cetirizine (ZYRTEC) 10 MG tablet Take 10 mg by mouth daily.     Cholecalciferol (VITAMIN D) 50 MCG (2000 UT) CAPS Take 2  capsules (4,000 Units total) by mouth daily. 30 capsule    ezetimibe (ZETIA) 10 MG tablet Take 10 mg by mouth daily.  0   famotidine (PEPCID) 10 MG tablet Chew 10 mg by mouth daily.      FOLIC ACID PO Take 563 mcg by mouth daily.      JARDIANCE 25 MG TABS tablet Take 25 mg by mouth daily.     omeprazole-sodium bicarbonate (ZEGERID) 40-1100 MG per capsule Take 1 capsule by mouth daily before breakfast.     ONE TOUCH ULTRA TEST test strip      ramipril (ALTACE) 2.5 MG capsule Take 1 capsule by mouth daily. Pt take 10 mg a day     rosuvastatin (CRESTOR) 40 MG tablet Take 40 mg by mouth daily.      sertraline (ZOLOFT) 100 MG tablet Take 50 mg by mouth daily.      EPINEPHrine (EPIPEN 2-PAK) 0.3 mg/0.3 mL IJ SOAJ injection as directed Injection as needed for severe allergic reaction for 5 days (Patient not taking: Reported on 09/30/2022)     Glucagon (GVOKE HYPOPEN 2-PACK) 1 MG/0.2ML SOAJ as directed Subcutaneous for severe hypoglycemia     Current Facility-Administered Medications  Medication Dose Route Frequency Provider Last Rate Last Admin   0.9 %  sodium chloride infusion  500 mL Intravenous Once Lavell Ridings, Lajuan Lines, MD        Allergies as of 10/12/2022 - Review Complete 10/12/2022  Allergen Reaction Noted   Diflucan [fluconazole]  07/18/2014   Shellfish allergy Itching 09/11/2014   Doxycycline hyclate Other (See Comments) 10/12/2022   Percocet [oxycodone-acetaminophen] Other (See Comments) 09/17/2014   Shellfish-derived products Itching 10/12/2022   Doxycycline Itching and Rash 07/18/2014   Griseofulvin Rash 07/18/2014    Family History  Problem Relation Age of Onset   Diabetes Mother    Heart disease Mother    Heart disease Father    Colon polyps Father    Thyroid disease Sister    Colon polyps Sister    Rectal cancer Paternal Aunt    Asthma Maternal Grandmother 4   Diabetes Maternal Grandmother    Breast cancer Maternal Grandmother    Diabetes Maternal Grandfather    Graves'  disease Son    Thyroid disease Son    Stomach cancer Neg Hx    Esophageal cancer Neg Hx    Colon cancer Neg Hx    Crohn's disease Neg Hx    Ulcerative colitis Neg Hx     Social History   Socioeconomic History   Marital status: Married    Spouse name: Not on file   Number of children: 2   Years of education: Not on file   Highest education level: Not on file  Occupational History   Occupation: retired   Occupation: Materials engineer  Tobacco Use   Smoking status: Never    Passive exposure: Past (PARENTS SMOKED)   Smokeless tobacco: Never  Vaping Use   Vaping Use: Never used  Substance and Sexual Activity   Alcohol use: Not Currently    Comment: rare   Drug use: No   Sexual activity: Yes    Partners: Male    Birth control/protection: Surgical    Comment: hysterectomy  Other Topics Concern   Not on file  Social History Narrative   Not on file   Social Determinants of Health   Financial Resource Strain: Not on file  Food Insecurity: Not on file  Transportation Needs: Not on file  Physical Activity: Not on file  Stress: Not on file  Social Connections: Not on file  Intimate Partner Violence: Not on file    Physical Exam: Vital signs in last 24 hours: '@BP'$  (!) 148/85   Pulse 69   Temp 97.8 F (36.6 C)   Ht '5\' 6"'$  (1.676 m)   Wt 170 lb (77.1 kg)   SpO2 97%   BMI 27.44 kg/m  GEN: NAD EYE: Sclerae anicteric ENT: MMM CV: Non-tachycardic Pulm: CTA b/l GI: Soft, NT/ND NEURO:  Alert & Oriented x 3   Zenovia Jarred, MD [facility]  10/12/2022 10:52 AM

## 2022-10-12 NOTE — Patient Instructions (Signed)

## 2022-10-13 ENCOUNTER — Telehealth: Payer: Self-pay | Admitting: *Deleted

## 2022-10-13 NOTE — Telephone Encounter (Signed)
  Follow up Call-     10/12/2022   10:11 AM  Call back number  Post procedure Call Back phone  # [phone]  Permission to leave phone message Yes     Patient questions:  Do you have a fever, pain , or abdominal swelling? No. Pain Score  0 *  Have you tolerated food without any problems? Yes.    Have you been able to return to your normal activities? Yes.    Do you have any questions about your discharge instructions: Diet   No. Medications  No. Follow up visit  No.  Do you have questions or concerns about your Care? No.  Actions: * If pain score is 4 or above: No action needed, pain <4.

## 2022-10-14 ENCOUNTER — Encounter: Payer: Self-pay | Admitting: Internal Medicine

## 2022-10-21 DIAGNOSIS — L570 Actinic keratosis: Secondary | ICD-10-CM | POA: Diagnosis not present

## 2022-10-21 DIAGNOSIS — D2262 Melanocytic nevi of left upper limb, including shoulder: Secondary | ICD-10-CM | POA: Diagnosis not present

## 2022-10-21 DIAGNOSIS — D485 Neoplasm of uncertain behavior of skin: Secondary | ICD-10-CM | POA: Diagnosis not present

## 2022-10-21 DIAGNOSIS — Z808 Family history of malignant neoplasm of other organs or systems: Secondary | ICD-10-CM | POA: Diagnosis not present

## 2022-10-21 DIAGNOSIS — L820 Inflamed seborrheic keratosis: Secondary | ICD-10-CM | POA: Diagnosis not present

## 2022-10-21 DIAGNOSIS — Z85828 Personal history of other malignant neoplasm of skin: Secondary | ICD-10-CM | POA: Diagnosis not present

## 2022-10-21 DIAGNOSIS — L578 Other skin changes due to chronic exposure to nonionizing radiation: Secondary | ICD-10-CM | POA: Diagnosis not present

## 2022-10-21 DIAGNOSIS — L821 Other seborrheic keratosis: Secondary | ICD-10-CM | POA: Diagnosis not present

## 2022-10-22 DIAGNOSIS — I10 Essential (primary) hypertension: Secondary | ICD-10-CM | POA: Diagnosis not present

## 2022-10-22 DIAGNOSIS — Z794 Long term (current) use of insulin: Secondary | ICD-10-CM | POA: Diagnosis not present

## 2022-10-22 DIAGNOSIS — I700 Atherosclerosis of aorta: Secondary | ICD-10-CM | POA: Diagnosis not present

## 2022-10-22 DIAGNOSIS — Z4681 Encounter for fitting and adjustment of insulin pump: Secondary | ICD-10-CM | POA: Diagnosis not present

## 2022-10-22 DIAGNOSIS — E785 Hyperlipidemia, unspecified: Secondary | ICD-10-CM | POA: Diagnosis not present

## 2022-10-22 DIAGNOSIS — H8190 Unspecified disorder of vestibular function, unspecified ear: Secondary | ICD-10-CM | POA: Diagnosis not present

## 2022-10-22 DIAGNOSIS — E139 Other specified diabetes mellitus without complications: Secondary | ICD-10-CM | POA: Diagnosis not present

## 2022-10-22 DIAGNOSIS — N200 Calculus of kidney: Secondary | ICD-10-CM | POA: Diagnosis not present

## 2022-10-22 DIAGNOSIS — G4730 Sleep apnea, unspecified: Secondary | ICD-10-CM | POA: Diagnosis not present

## 2022-10-22 DIAGNOSIS — E10319 Type 1 diabetes mellitus with unspecified diabetic retinopathy without macular edema: Secondary | ICD-10-CM | POA: Diagnosis not present

## 2022-10-22 DIAGNOSIS — E668 Other obesity: Secondary | ICD-10-CM | POA: Diagnosis not present

## 2022-11-12 DIAGNOSIS — M25551 Pain in right hip: Secondary | ICD-10-CM | POA: Diagnosis not present

## 2022-11-16 DIAGNOSIS — Z1231 Encounter for screening mammogram for malignant neoplasm of breast: Secondary | ICD-10-CM | POA: Diagnosis not present

## 2022-11-20 DIAGNOSIS — M6281 Muscle weakness (generalized): Secondary | ICD-10-CM | POA: Diagnosis not present

## 2022-11-20 DIAGNOSIS — S76311D Strain of muscle, fascia and tendon of the posterior muscle group at thigh level, right thigh, subsequent encounter: Secondary | ICD-10-CM | POA: Diagnosis not present

## 2022-11-25 DIAGNOSIS — M6281 Muscle weakness (generalized): Secondary | ICD-10-CM | POA: Diagnosis not present

## 2022-11-25 DIAGNOSIS — S76311D Strain of muscle, fascia and tendon of the posterior muscle group at thigh level, right thigh, subsequent encounter: Secondary | ICD-10-CM | POA: Diagnosis not present

## 2022-11-27 DIAGNOSIS — M6281 Muscle weakness (generalized): Secondary | ICD-10-CM | POA: Diagnosis not present

## 2022-11-27 DIAGNOSIS — S76311D Strain of muscle, fascia and tendon of the posterior muscle group at thigh level, right thigh, subsequent encounter: Secondary | ICD-10-CM | POA: Diagnosis not present

## 2022-12-02 DIAGNOSIS — S76311D Strain of muscle, fascia and tendon of the posterior muscle group at thigh level, right thigh, subsequent encounter: Secondary | ICD-10-CM | POA: Diagnosis not present

## 2022-12-02 DIAGNOSIS — M6281 Muscle weakness (generalized): Secondary | ICD-10-CM | POA: Diagnosis not present

## 2022-12-07 DIAGNOSIS — S76311D Strain of muscle, fascia and tendon of the posterior muscle group at thigh level, right thigh, subsequent encounter: Secondary | ICD-10-CM | POA: Diagnosis not present

## 2022-12-07 DIAGNOSIS — M6281 Muscle weakness (generalized): Secondary | ICD-10-CM | POA: Diagnosis not present

## 2022-12-09 DIAGNOSIS — M6281 Muscle weakness (generalized): Secondary | ICD-10-CM | POA: Diagnosis not present

## 2022-12-09 DIAGNOSIS — S76311D Strain of muscle, fascia and tendon of the posterior muscle group at thigh level, right thigh, subsequent encounter: Secondary | ICD-10-CM | POA: Diagnosis not present

## 2022-12-28 DIAGNOSIS — M6281 Muscle weakness (generalized): Secondary | ICD-10-CM | POA: Diagnosis not present

## 2022-12-28 DIAGNOSIS — S76311D Strain of muscle, fascia and tendon of the posterior muscle group at thigh level, right thigh, subsequent encounter: Secondary | ICD-10-CM | POA: Diagnosis not present

## 2022-12-30 DIAGNOSIS — S76311D Strain of muscle, fascia and tendon of the posterior muscle group at thigh level, right thigh, subsequent encounter: Secondary | ICD-10-CM | POA: Diagnosis not present

## 2022-12-30 DIAGNOSIS — M6281 Muscle weakness (generalized): Secondary | ICD-10-CM | POA: Diagnosis not present

## 2023-01-07 DIAGNOSIS — E10319 Type 1 diabetes mellitus with unspecified diabetic retinopathy without macular edema: Secondary | ICD-10-CM | POA: Diagnosis not present

## 2023-01-07 DIAGNOSIS — E139 Other specified diabetes mellitus without complications: Secondary | ICD-10-CM | POA: Diagnosis not present

## 2023-01-07 DIAGNOSIS — E785 Hyperlipidemia, unspecified: Secondary | ICD-10-CM | POA: Diagnosis not present

## 2023-01-07 DIAGNOSIS — Z4681 Encounter for fitting and adjustment of insulin pump: Secondary | ICD-10-CM | POA: Diagnosis not present

## 2023-01-07 DIAGNOSIS — Z794 Long term (current) use of insulin: Secondary | ICD-10-CM | POA: Diagnosis not present

## 2023-01-07 DIAGNOSIS — I10 Essential (primary) hypertension: Secondary | ICD-10-CM | POA: Diagnosis not present

## 2023-01-07 DIAGNOSIS — K760 Fatty (change of) liver, not elsewhere classified: Secondary | ICD-10-CM | POA: Diagnosis not present

## 2023-01-12 DIAGNOSIS — M6281 Muscle weakness (generalized): Secondary | ICD-10-CM | POA: Diagnosis not present

## 2023-01-12 DIAGNOSIS — S76311D Strain of muscle, fascia and tendon of the posterior muscle group at thigh level, right thigh, subsequent encounter: Secondary | ICD-10-CM | POA: Diagnosis not present

## 2023-01-20 DIAGNOSIS — S76311D Strain of muscle, fascia and tendon of the posterior muscle group at thigh level, right thigh, subsequent encounter: Secondary | ICD-10-CM | POA: Diagnosis not present

## 2023-01-20 DIAGNOSIS — M6281 Muscle weakness (generalized): Secondary | ICD-10-CM | POA: Diagnosis not present

## 2023-01-21 DIAGNOSIS — M545 Low back pain, unspecified: Secondary | ICD-10-CM | POA: Diagnosis not present

## 2023-01-22 DIAGNOSIS — S76311D Strain of muscle, fascia and tendon of the posterior muscle group at thigh level, right thigh, subsequent encounter: Secondary | ICD-10-CM | POA: Diagnosis not present

## 2023-01-22 DIAGNOSIS — M5416 Radiculopathy, lumbar region: Secondary | ICD-10-CM | POA: Diagnosis not present

## 2023-01-22 DIAGNOSIS — M6281 Muscle weakness (generalized): Secondary | ICD-10-CM | POA: Diagnosis not present

## 2023-02-03 DIAGNOSIS — M545 Low back pain, unspecified: Secondary | ICD-10-CM | POA: Diagnosis not present

## 2023-02-09 DIAGNOSIS — M545 Low back pain, unspecified: Secondary | ICD-10-CM | POA: Diagnosis not present

## 2023-02-24 DIAGNOSIS — M5416 Radiculopathy, lumbar region: Secondary | ICD-10-CM | POA: Diagnosis not present

## 2023-03-16 DIAGNOSIS — M5416 Radiculopathy, lumbar region: Secondary | ICD-10-CM | POA: Diagnosis not present

## 2023-04-23 DIAGNOSIS — M5416 Radiculopathy, lumbar region: Secondary | ICD-10-CM | POA: Diagnosis not present

## 2023-04-27 ENCOUNTER — Ambulatory Visit: Payer: Medicare Other | Admitting: Neurology

## 2023-05-04 DIAGNOSIS — E785 Hyperlipidemia, unspecified: Secondary | ICD-10-CM | POA: Diagnosis not present

## 2023-05-04 DIAGNOSIS — G4730 Sleep apnea, unspecified: Secondary | ICD-10-CM | POA: Diagnosis not present

## 2023-05-04 DIAGNOSIS — H8190 Unspecified disorder of vestibular function, unspecified ear: Secondary | ICD-10-CM | POA: Diagnosis not present

## 2023-05-04 DIAGNOSIS — Z4681 Encounter for fitting and adjustment of insulin pump: Secondary | ICD-10-CM | POA: Diagnosis not present

## 2023-05-04 DIAGNOSIS — Z794 Long term (current) use of insulin: Secondary | ICD-10-CM | POA: Diagnosis not present

## 2023-05-04 DIAGNOSIS — M8580 Other specified disorders of bone density and structure, unspecified site: Secondary | ICD-10-CM | POA: Diagnosis not present

## 2023-05-04 DIAGNOSIS — E139 Other specified diabetes mellitus without complications: Secondary | ICD-10-CM | POA: Diagnosis not present

## 2023-05-04 DIAGNOSIS — E113312 Type 2 diabetes mellitus with moderate nonproliferative diabetic retinopathy with macular edema, left eye: Secondary | ICD-10-CM | POA: Diagnosis not present

## 2023-05-04 DIAGNOSIS — K5730 Diverticulosis of large intestine without perforation or abscess without bleeding: Secondary | ICD-10-CM | POA: Diagnosis not present

## 2023-05-04 DIAGNOSIS — E10319 Type 1 diabetes mellitus with unspecified diabetic retinopathy without macular edema: Secondary | ICD-10-CM | POA: Diagnosis not present

## 2023-05-04 DIAGNOSIS — E668 Other obesity: Secondary | ICD-10-CM | POA: Diagnosis not present

## 2023-05-04 DIAGNOSIS — I700 Atherosclerosis of aorta: Secondary | ICD-10-CM | POA: Diagnosis not present

## 2023-05-04 DIAGNOSIS — I10 Essential (primary) hypertension: Secondary | ICD-10-CM | POA: Diagnosis not present

## 2023-05-17 DIAGNOSIS — H43822 Vitreomacular adhesion, left eye: Secondary | ICD-10-CM | POA: Diagnosis not present

## 2023-05-17 DIAGNOSIS — H35352 Cystoid macular degeneration, left eye: Secondary | ICD-10-CM | POA: Diagnosis not present

## 2023-05-17 DIAGNOSIS — E113393 Type 2 diabetes mellitus with moderate nonproliferative diabetic retinopathy without macular edema, bilateral: Secondary | ICD-10-CM | POA: Diagnosis not present

## 2023-05-17 DIAGNOSIS — H35072 Retinal telangiectasis, left eye: Secondary | ICD-10-CM | POA: Diagnosis not present

## 2023-05-19 NOTE — Patient Instructions (Addendum)
Please continue using your CPAP regularly. While your insurance requires that you use CPAP at least 4 hours each night on 70% of the nights, I recommend, that you not skip any nights and use it throughout the night if you can. Getting used to CPAP and staying with the treatment long term does take time and patience and discipline. Untreated obstructive sleep apnea when it is moderate to severe can have an adverse impact on cardiovascular health and raise her risk for heart disease, arrhythmias, hypertension, congestive heart failure, stroke and diabetes. Untreated obstructive sleep apnea causes sleep disruption, nonrestorative sleep, and sleep deprivation. This can have an impact on your day to day functioning and cause daytime sleepiness and impairment of cognitive function, memory loss, mood disturbance, and problems focussing. Using CPAP regularly can improve these symptoms.  We will update supply orders, today. You can try a nasal pillow mask if you like. Try work on compliance. I will reprint download in 3-4 months to check compliance.   Follow up in 1 year

## 2023-05-19 NOTE — Progress Notes (Signed)
PATIENT: Kelly Glover DOB: 12/25/1950  REASON FOR VISIT: follow up HISTORY FROM: patient  Chief Complaint  Patient presents with   Room 2    Pt is here Alone. Pt states that things have been going okay with her CPAP Machine. Pt is wanting to discuss trying the nasal mask. Pt states that she doesn't have any complaints or concerns to report today.      HISTORY OF PRESENT ILLNESS:  05/25/23 ALL:  Kelly Glover is a 72 y.o. female here today for follow up for OSA on CPAP. She was last seen by Dr Vickey Huger 04/2022 and struggling to meet compliance recommendations due to chronic cough. Since, she reports doing fairly well. She continues to have lower compliance data due to traveling. She tolerates therapy well. She may have a little congestion and dry eye on nights she uses CPAP. Sheis using a nasal mask and would like to try a nasal pillow. She does not like to pack CPAP when traveling. Travel CPAP is too expensive. 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 She also denies any further syncopal episodes on therapy.     HISTORY: (copied from Dr Dohmeier's previous note)  Kelly Glover is a 72 y.o.  Caucasian female patient seen here in a RV on   04-22-2022:  The patient still reports less headaches, her eye condition improved as confirmed by Dr. Luciana Axe. She reports a frequent coughing, and she developed a cough in the last 3 months, COVID testing repeatedly was negative. Has a mast cell disorder- autoimmune challenge.  Today's compliance download for 11 hours shows that she has used the machine from mid June to July for 40% of the time with an average of 5 hours and 23 minutes on days used, but the 90-day review shows a compliance of 46 out of 90 days 51% with an average of 5 hours and 36 minutes on days used.  She stated she traveled a lot and did not always take the machine with her.  She also has reported this  cough that does bother her and it is hard to cough by wearing CPAP.  The mean pressure at the 95th percentile is between 8.7 and 8.5 cm water and the average AHI is between 2.2 and 2.7/h.  No significant central apnea arising.  Average leak is low 4.7 L/min.  And was a little bit higher just the last 30 days.   Medication has not drastically changed, the Epworth sleepiness score was endorsed at 4/ 24  points, the fatigue severity scale was not requested today.  2022:  Patient has used her new CPAP with some effort but also with success, and she made an interesting discovery- she used a pulse-oximetry device and fell asleep without CPAP in place,then put CPAP on at 1.45 AM and the oxygen levels were clearly better and less variable , her pulse rate was slower.  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 The Compliance record is excellent. The  couple has been on a Cruise, a wedding in [place] and contracted Covid 19. She recovered well from flu like symptoms. Treated with Paxlovid- She would like to purchase a travel CPAP- Epworth 3 points !   Much improved.  LUNA machine - she assured me she used it 28 nights but this is not recorded: low compliance on this months record.  5-12 cm water range,  14/ 24 days- average use 4.30 minutes.  95% pressure of 8.3 cm water, which would be setting for her travel CPAP. Variable leaks- nasal mask, FFM- she wants to try a nasal pillow. Bella swift ear loops. Please order and fit.   Consult note 2022.  Kelly Glover is here today for a follow-up she underwent a split-night polysomnography study on December 04, 2020 which revealed apnea to be present this was an mild apnea category with an AHI of 15.8 and this to have much higher REM sleep AHI than non-REM sleep AHI.  Supine sleep also accentuated the apnea hypopnea index 25.9/h versus a nonsupine apnea-hypopnea index of 10.3.  There were prolonged periods of hypoxemia hypoxia at nadir was 69% saturation.  So  the treatment for this constellation is usually a positive airway pressure therapy the patient started on CPAP. Dr Luciana Axe, ophthalmologist-the mean pressure usage is 7.1 cmH2O the average 95th percentile pressure is 9.1 cmH2O these are still mild pressures required to overcome this patient's apnea.  She has very minimal air leakage on average 2 minutes she has an average leak of 7.5 L/min that is mild as well and her residual apnea hypopnea index is now 2.4 which is a good resolution of sleep apnea.  She has used the machine 30 out of 30 days the total usage and date range was 5 hours 32 minutes on average this fulfills the criteria of compliance by her insurance company.  Ramp pressure is 4 cmH2O minimum pressure maintained is 5 and maximum pressure is 12 cmH2O.  She has access to oximetry and this documented resolution of hypoxemia. This will please dr Luciana Axe as well  Epworth score: 2/24  No more morning headache ! ROS: 30 sec drop attack, looking pale. Had 2 of these- not sure of cause.   Referral from Dr Luciana Axe, concerned about hypoxemia at night , measured by ring, and cystoid macular edema. .  Chief concern according to patient :  Rm 11 with husband, Dr. Tenny Craw.  Pt reports she is here to discuss sleep study per Dr. 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 She denies prior sleep study and reports she does snore at night.   I have the pleasure of seeing Kelly Glover today, a right -handed Caucasian female with a possible sleep disorder.  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   Sleep relevant medical history: Nocturia once at 4.30 AM. Family medical /sleep history: 2  sisters  suspected to have OSA, no insomnia, no sleep walkers.    Social history:  adult children , some out of state. Husband is MD> Patient is retired, homemaker, and lives in a household with 2 persons, 3 grandchildren visit often.  Tobacco use; never.  ETOH use ; none ,  Caffeine intake in form of Coffee( all day, 2 cups  In AM one at lunch).    Sleep habits are as follows: The patient's dinner time is between 5-7 PM. The patient goes to bed at 10.30 PM and continues to sleep for 5 hours, wakes for one bathroom breaks.  She likes the bedroom cool, quiet and dark.   The preferred sleep position is side , with the support of 2  pillows.  Dreams are reportedly infrequent/vivid.  7 AM is the usual rise time. The patient wakes up spontaneously.   She reports  feeling refreshed and restored in AM, lately she had headaches in AM with symptoms such as dry mouth, morning headaches. No Naps are taken.Given the documented hypoxemia I suspect that the patient will have a REM dependent sleep apnea.  The longest periods of oxygen desaturation all of her in the early morning hours and are not related to her usual bathroom break time.  Follow while there is some motion artifact around 430 there are prolonged hypoxemia.  Already at 230 and also is late at 6 AM.  And this has been repeatedly demonstrated throughout the night.  Her pulse range does not respond as nimble to these oxygen desaturations.  She has been taking metoprolol as needed and not on a regular basis and this could affect the response of this of her heart rate.  The ring will not allow me to see if she has arrhythmia only a heart rate variability can be demonstrated and her pulse rate at the highest was only 78 bpm.  The lowest oxygen saturation at nadir 77%.  I agree with Dr. Luciana Axe that an evaluation and treatment for sleep apnea should be performed given that this may reverse some of the macular edema especially in the left eye.     REVIEW OF  SYSTEMS: Out of a complete 14 system review of symptoms, the patient complains only of the following symptoms, dry eyes, congestion and all other reviewed systems are negative.  ESS: 3/24  ALLERGIES: Allergies  Allergen Reactions   Diflucan [Fluconazole]     Itching, palms red, throat itchy.    Shellfish Allergy Itching    Redness, throat constriction   Doxycycline Hyclate Other (See Comments)   Percocet [Oxycodone-Acetaminophen] Other (See Comments)    Diaphoresis, nausea, confusion.    Shellfish-Derived Products Itching   Doxycycline Itching and Rash    Itchy throat   Griseofulvin Rash    HOME MEDICATIONS: Outpatient Medications Prior to Visit  Medication Sig Dispense Refill   aspirin EC 81 MG tablet Take 81 mg by mouth daily.     cetirizine (ZYRTEC) 10 MG tablet Take 10 mg by mouth daily.     Cholecalciferol (VITAMIN D) 50 MCG (2000 UT) CAPS Take 2 capsules (4,000 Units total) by mouth daily. 30 capsule    ezetimibe (ZETIA) 10 MG tablet Take 10 mg by mouth daily.  0   famotidine (PEPCID) 10 MG tablet Chew 10 mg by mouth daily.      FOLIC ACID PO Take 400 mcg by mouth daily.      Glucagon (GVOKE HYPOPEN 2-PACK) 1 MG/0.2ML SOAJ as directed Subcutaneous for severe hypoglycemia     JARDIANCE 25 MG TABS tablet Take 25 mg by mouth daily.     omeprazole-sodium bicarbonate (ZEGERID) 40-1100 MG per capsule Take 1 capsule by mouth daily before breakfast.     ONE TOUCH ULTRA TEST test strip      ramipril (ALTACE) 2.5 MG capsule Take 1 capsule by mouth daily. Pt take 10 mg a day     rosuvastatin (CRESTOR) 40 MG tablet Take 40 mg by mouth daily.      sertraline (ZOLOFT) 100 MG tablet Take 50 mg by mouth daily.      EPINEPHrine (EPIPEN 2-PAK) 0.3 mg/0.3 mL IJ SOAJ injection as directed Injection as needed for severe allergic reaction for 5 days (Patient not taking: Reported on 09/30/2022)  No facility-administered medications prior to visit.    PAST MEDICAL HISTORY: Past Medical  History:  Diagnosis Date   Allergy    SEASONAL   Aortic atherosclerosis (HCC)    Arthritis    Cataract    BILATERAL,REMOVED   Colon polyps    Diabetes (HCC)    Diverticulitis    Diverticulosis    DM (diabetes mellitus) (HCC)    GERD (gastroesophageal reflux disease)    Hepatic steatosis    History of diverticulitis    History of kidney stones    Hyperlipidemia    Kidney stone    Mast cell disorder    inappropriate mast cell activation   Nephrolithiasis    Neuromuscular disorder (HCC)    inappropriate mast cell response disorder    Obesity    PONV (postoperative nausea and vomiting)    Sleep apnea    Squamous cell carcinoma of left lower leg 10/05/2013    PAST SURGICAL HISTORY: Past Surgical History:  Procedure Laterality Date   BLADDER SURGERY     BREAST BIOPSY Left ?   benign   CATARACTS     COLONOSCOPY  2018   DIAGNOSTIC LAPAROSCOPY     RECTOCELE REPAIR N/A 09/17/2014   Procedure: POSTERIOR REPAIR , enterocele repair;  Surgeon: Jacqualin Combes de Gwenevere Ghazi, MD;  Location: [facility];  Service: Gynecology;  Laterality: N/A;   SALPINGOOPHORECTOMY     TONSILLECTOMY  childhood   TOTAL ABDOMINAL HYSTERECTOMY W/ BILATERAL SALPINGOOPHORECTOMY  05/20/1998    FAMILY HISTORY: Family History  Problem Relation Age of Onset   Diabetes Mother    Heart disease Mother    Heart disease Father    Colon polyps Father    Thyroid disease Sister    Colon polyps Sister    Rectal cancer Paternal Aunt    Asthma Maternal Grandmother 32   Diabetes Maternal Grandmother    Breast cancer Maternal Grandmother    Diabetes Maternal Grandfather    Graves' disease Son    Thyroid disease Son    Stomach cancer Neg Hx    Esophageal cancer Neg Hx    Colon cancer Neg Hx    Crohn's disease Neg Hx    Ulcerative colitis Neg Hx     SOCIAL HISTORY: Social History   Socioeconomic History   Marital status: Married    Spouse name: Not on file   Number of children: 2   Years of  education: Not on file   Highest education level: Not on file  Occupational History   Occupation: retired   Occupation: Arts development officer  Tobacco Use   Smoking status: Never    Passive exposure: Past (PARENTS SMOKED)   Smokeless tobacco: Never  Vaping Use   Vaping status: Never Used  Substance and Sexual Activity   Alcohol use: Not Currently    Comment: rare   Drug use: No   Sexual activity: Yes    Partners: Male    Birth control/protection: Surgical    Comment: hysterectomy  Other Topics Concern   Not on file  Social History Narrative   Not on file   Social Determinants of Health   Financial Resource Strain: Not on file  Food Insecurity: Not on file  Transportation Needs: Not on file  Physical Activity: Not on file  Stress: Not on file  Social Connections: Not on file  Intimate Partner Violence: Not on file     PHYSICAL EXAM  Vitals:   05/25/23 1001  BP: (!) 144/79  Pulse: 63   There is no height or weight on file to calculate BMI.  Generalized: Well developed, in no acute distress  Cardiology: normal rate and rhythm, no murmur noted Respiratory: clear to auscultation bilaterally  Neurological examination  Mentation: Alert oriented to time, place, history taking. Follows all commands speech and language fluent Cranial nerve II-XII: Pupils were equal round reactive to light. Extraocular movements were full, visual field were full  Motor: The motor testing reveals 5 over 5 strength of all 4 extremities. Good symmetric motor tone is noted throughout.  Gait and station: Gait is normal.    DIAGNOSTIC DATA (LABS, IMAGING, TESTING) - I reviewed patient records, labs, notes, testing and imaging myself where available.      No data to display           Lab Results  Component Value Date   WBC 14.4 (H) 06/26/2017   HGB 14.7 06/26/2017   HCT 43.2 06/26/2017   MCV 90.2 06/26/2017   PLT 225 06/26/2017      Component Value Date/Time   NA 137 06/26/2017 1531   K  4.2 06/26/2017 1531   CL 102 06/26/2017 1531   CO2 25 06/26/2017 1531   GLUCOSE 171 (H) 06/26/2017 1531   BUN 17 06/26/2017 1531   CREATININE 0.74 06/26/2017 1531   CALCIUM 9.7 06/26/2017 1531   PROT 7.7 06/26/2017 1531   ALBUMIN 4.2 06/26/2017 1531   AST 18 06/26/2017 1531   ALT 26 06/26/2017 1531   ALKPHOS 75 06/26/2017 1531   BILITOT 0.7 06/26/2017 1531   GFRNONAA >60 06/26/2017 1531   GFRAA >60 06/26/2017 1531   No results found for: "CHOL", "HDL", "LDLCALC", "LDLDIRECT", "TRIG", "CHOLHDL" No results found for: "HGBA1C" No results found for: "VITAMINB12" No results found for: "TSH"   ASSESSMENT AND PLAN 72 y.o. year old female  has a past medical history of Allergy, Aortic atherosclerosis (HCC), Arthritis, Cataract, Colon polyps, Diabetes (HCC), Diverticulitis, Diverticulosis, DM (diabetes mellitus) (HCC), GERD (gastroesophageal reflux disease), Hepatic steatosis, History of diverticulitis, History of kidney stones, Hyperlipidemia, Kidney stone, Mast cell disorder, Nephrolithiasis, Neuromuscular disorder (HCC), Obesity, PONV (postoperative nausea and vomiting), Sleep apnea, and Squamous cell carcinoma of left lower leg (10/05/2013). here with     ICD-10-CM   1. OSA on CPAP  G47.33 For home use only DME continuous positive airway pressure (CPAP)       MEOSHIA HAWKEN is doing well on CPAP therapy. Compliance report reveals suboptimal use. She was encouraged to continue using CPAP nightly and for greater than 4 hours each night. I will recheck compliance in 90 days as travel should be less at that time. We will update supply orders as indicated. I have advised she try nasal pillow. We reviewed previous sleep study. Risks of untreated sleep apnea review and education materials provided. Consider travel machine. Healthy lifestyle habits encouraged. She will follow up in 1 year, sooner if needed. She verbalizes understanding and agreement with this plan.    Orders Placed This Encounter   Procedures   For home use only DME continuous positive airway pressure (CPAP)    Supplies, she would like to try a nasal pillow style mask.    Order Specific Question:   Length of Need    Answer:   Lifetime    Order Specific Question:   Patient has OSA or probable OSA    Answer:   Yes    Order Specific Question:   Is the patient currently using CPAP in  the home    Answer:   Yes    Order Specific Question:   Settings    Answer:   Other see comments    Order Specific Question:   CPAP supplies needed    Answer:   Mask, headgear, cushions, filters, heated tubing and water chamber     No orders of the defined types were placed in this encounter.     Shawnie Dapper, FNP-C 05/25/2023, 10:57 AM [facility] [address], [address] [place], [place] [address] [phone]

## 2023-05-25 ENCOUNTER — Encounter: Payer: Self-pay | Admitting: Family Medicine

## 2023-05-25 ENCOUNTER — Ambulatory Visit (INDEPENDENT_AMBULATORY_CARE_PROVIDER_SITE_OTHER): Payer: Medicare Other | Admitting: Family Medicine

## 2023-05-25 VITALS — BP 144/79 | HR 63

## 2023-05-25 DIAGNOSIS — G4733 Obstructive sleep apnea (adult) (pediatric): Secondary | ICD-10-CM

## 2023-06-01 DIAGNOSIS — L821 Other seborrheic keratosis: Secondary | ICD-10-CM | POA: Diagnosis not present

## 2023-06-01 DIAGNOSIS — D485 Neoplasm of uncertain behavior of skin: Secondary | ICD-10-CM | POA: Diagnosis not present

## 2023-06-01 DIAGNOSIS — L570 Actinic keratosis: Secondary | ICD-10-CM | POA: Diagnosis not present

## 2023-06-01 DIAGNOSIS — B079 Viral wart, unspecified: Secondary | ICD-10-CM | POA: Diagnosis not present

## 2023-06-16 ENCOUNTER — Ambulatory Visit: Payer: Medicare Other | Admitting: Family Medicine

## 2023-07-20 DIAGNOSIS — Z23 Encounter for immunization: Secondary | ICD-10-CM | POA: Diagnosis not present

## 2023-08-10 DIAGNOSIS — E785 Hyperlipidemia, unspecified: Secondary | ICD-10-CM | POA: Diagnosis not present

## 2023-08-10 DIAGNOSIS — Z4681 Encounter for fitting and adjustment of insulin pump: Secondary | ICD-10-CM | POA: Diagnosis not present

## 2023-08-10 DIAGNOSIS — I10 Essential (primary) hypertension: Secondary | ICD-10-CM | POA: Diagnosis not present

## 2023-08-10 DIAGNOSIS — Z794 Long term (current) use of insulin: Secondary | ICD-10-CM | POA: Diagnosis not present

## 2023-08-10 DIAGNOSIS — E10319 Type 1 diabetes mellitus with unspecified diabetic retinopathy without macular edema: Secondary | ICD-10-CM | POA: Diagnosis not present

## 2023-08-10 DIAGNOSIS — K760 Fatty (change of) liver, not elsewhere classified: Secondary | ICD-10-CM | POA: Diagnosis not present

## 2023-08-10 DIAGNOSIS — E139 Other specified diabetes mellitus without complications: Secondary | ICD-10-CM | POA: Diagnosis not present

## 2023-08-17 DIAGNOSIS — H5203 Hypermetropia, bilateral: Secondary | ICD-10-CM | POA: Diagnosis not present

## 2023-08-17 DIAGNOSIS — E119 Type 2 diabetes mellitus without complications: Secondary | ICD-10-CM | POA: Diagnosis not present

## 2023-08-18 DIAGNOSIS — M542 Cervicalgia: Secondary | ICD-10-CM | POA: Diagnosis not present

## 2023-08-18 DIAGNOSIS — M4722 Other spondylosis with radiculopathy, cervical region: Secondary | ICD-10-CM | POA: Diagnosis not present

## 2023-08-18 DIAGNOSIS — M25512 Pain in left shoulder: Secondary | ICD-10-CM | POA: Diagnosis not present

## 2023-08-31 DIAGNOSIS — M542 Cervicalgia: Secondary | ICD-10-CM | POA: Diagnosis not present

## 2023-09-15 DIAGNOSIS — M4722 Other spondylosis with radiculopathy, cervical region: Secondary | ICD-10-CM | POA: Diagnosis not present

## 2023-09-22 DIAGNOSIS — M5412 Radiculopathy, cervical region: Secondary | ICD-10-CM | POA: Diagnosis not present

## 2023-10-11 DIAGNOSIS — M5412 Radiculopathy, cervical region: Secondary | ICD-10-CM | POA: Diagnosis not present

## 2023-10-13 DIAGNOSIS — M5412 Radiculopathy, cervical region: Secondary | ICD-10-CM | POA: Diagnosis not present

## 2023-10-19 DIAGNOSIS — M5412 Radiculopathy, cervical region: Secondary | ICD-10-CM | POA: Diagnosis not present

## 2023-10-26 DIAGNOSIS — M5412 Radiculopathy, cervical region: Secondary | ICD-10-CM | POA: Diagnosis not present

## 2023-10-29 DIAGNOSIS — L570 Actinic keratosis: Secondary | ICD-10-CM | POA: Diagnosis not present

## 2023-10-29 DIAGNOSIS — L578 Other skin changes due to chronic exposure to nonionizing radiation: Secondary | ICD-10-CM | POA: Diagnosis not present

## 2023-10-29 DIAGNOSIS — Z85828 Personal history of other malignant neoplasm of skin: Secondary | ICD-10-CM | POA: Diagnosis not present

## 2023-10-29 DIAGNOSIS — L821 Other seborrheic keratosis: Secondary | ICD-10-CM | POA: Diagnosis not present

## 2023-10-29 DIAGNOSIS — D2262 Melanocytic nevi of left upper limb, including shoulder: Secondary | ICD-10-CM | POA: Diagnosis not present

## 2023-10-29 DIAGNOSIS — Z808 Family history of malignant neoplasm of other organs or systems: Secondary | ICD-10-CM | POA: Diagnosis not present

## 2023-11-02 DIAGNOSIS — R202 Paresthesia of skin: Secondary | ICD-10-CM | POA: Diagnosis not present

## 2023-11-02 DIAGNOSIS — M5412 Radiculopathy, cervical region: Secondary | ICD-10-CM | POA: Diagnosis not present

## 2023-11-08 DIAGNOSIS — M5412 Radiculopathy, cervical region: Secondary | ICD-10-CM | POA: Diagnosis not present

## 2023-11-10 DIAGNOSIS — M5412 Radiculopathy, cervical region: Secondary | ICD-10-CM | POA: Diagnosis not present

## 2023-11-30 DIAGNOSIS — G5602 Carpal tunnel syndrome, left upper limb: Secondary | ICD-10-CM | POA: Diagnosis not present

## 2023-12-01 DIAGNOSIS — M5412 Radiculopathy, cervical region: Secondary | ICD-10-CM | POA: Diagnosis not present

## 2023-12-23 DIAGNOSIS — Z6828 Body mass index (BMI) 28.0-28.9, adult: Secondary | ICD-10-CM | POA: Diagnosis not present

## 2023-12-23 DIAGNOSIS — G5602 Carpal tunnel syndrome, left upper limb: Secondary | ICD-10-CM | POA: Diagnosis not present

## 2024-01-04 DIAGNOSIS — E139 Other specified diabetes mellitus without complications: Secondary | ICD-10-CM | POA: Diagnosis not present

## 2024-01-04 DIAGNOSIS — Z4681 Encounter for fitting and adjustment of insulin pump: Secondary | ICD-10-CM | POA: Diagnosis not present

## 2024-01-04 DIAGNOSIS — Z794 Long term (current) use of insulin: Secondary | ICD-10-CM | POA: Diagnosis not present

## 2024-01-04 DIAGNOSIS — K760 Fatty (change of) liver, not elsewhere classified: Secondary | ICD-10-CM | POA: Diagnosis not present

## 2024-01-04 DIAGNOSIS — E10319 Type 1 diabetes mellitus with unspecified diabetic retinopathy without macular edema: Secondary | ICD-10-CM | POA: Diagnosis not present

## 2024-01-04 DIAGNOSIS — I10 Essential (primary) hypertension: Secondary | ICD-10-CM | POA: Diagnosis not present

## 2024-01-04 DIAGNOSIS — E785 Hyperlipidemia, unspecified: Secondary | ICD-10-CM | POA: Diagnosis not present

## 2024-03-01 DIAGNOSIS — M5412 Radiculopathy, cervical region: Secondary | ICD-10-CM | POA: Diagnosis not present

## 2024-03-14 DIAGNOSIS — M5412 Radiculopathy, cervical region: Secondary | ICD-10-CM | POA: Diagnosis not present

## 2024-04-03 DIAGNOSIS — I10 Essential (primary) hypertension: Secondary | ICD-10-CM | POA: Diagnosis not present

## 2024-04-03 DIAGNOSIS — E10319 Type 1 diabetes mellitus with unspecified diabetic retinopathy without macular edema: Secondary | ICD-10-CM | POA: Diagnosis not present

## 2024-04-03 DIAGNOSIS — E785 Hyperlipidemia, unspecified: Secondary | ICD-10-CM | POA: Diagnosis not present

## 2024-04-03 DIAGNOSIS — M8580 Other specified disorders of bone density and structure, unspecified site: Secondary | ICD-10-CM | POA: Diagnosis not present

## 2024-04-03 DIAGNOSIS — K219 Gastro-esophageal reflux disease without esophagitis: Secondary | ICD-10-CM | POA: Diagnosis not present

## 2024-04-03 LAB — LAB REPORT - SCANNED
A1c: 7.2
EGFR (Non-African Amer.): 81.8

## 2024-04-06 ENCOUNTER — Other Ambulatory Visit: Payer: Self-pay | Admitting: Endocrinology

## 2024-04-06 DIAGNOSIS — I700 Atherosclerosis of aorta: Secondary | ICD-10-CM | POA: Diagnosis not present

## 2024-04-06 DIAGNOSIS — K760 Fatty (change of) liver, not elsewhere classified: Secondary | ICD-10-CM

## 2024-04-06 DIAGNOSIS — Z0000 Encounter for general adult medical examination without abnormal findings: Secondary | ICD-10-CM | POA: Diagnosis not present

## 2024-04-06 DIAGNOSIS — I10 Essential (primary) hypertension: Secondary | ICD-10-CM | POA: Diagnosis not present

## 2024-04-06 DIAGNOSIS — E10319 Type 1 diabetes mellitus with unspecified diabetic retinopathy without macular edema: Secondary | ICD-10-CM | POA: Diagnosis not present

## 2024-04-06 DIAGNOSIS — N200 Calculus of kidney: Secondary | ICD-10-CM | POA: Diagnosis not present

## 2024-04-06 DIAGNOSIS — M8580 Other specified disorders of bone density and structure, unspecified site: Secondary | ICD-10-CM | POA: Diagnosis not present

## 2024-04-06 DIAGNOSIS — K5730 Diverticulosis of large intestine without perforation or abscess without bleeding: Secondary | ICD-10-CM | POA: Diagnosis not present

## 2024-04-06 DIAGNOSIS — Z4681 Encounter for fitting and adjustment of insulin pump: Secondary | ICD-10-CM | POA: Diagnosis not present

## 2024-04-06 DIAGNOSIS — R82998 Other abnormal findings in urine: Secondary | ICD-10-CM | POA: Diagnosis not present

## 2024-04-06 DIAGNOSIS — E785 Hyperlipidemia, unspecified: Secondary | ICD-10-CM | POA: Diagnosis not present

## 2024-04-06 DIAGNOSIS — G4730 Sleep apnea, unspecified: Secondary | ICD-10-CM | POA: Diagnosis not present

## 2024-04-06 DIAGNOSIS — H35352 Cystoid macular degeneration, left eye: Secondary | ICD-10-CM | POA: Diagnosis not present

## 2024-04-06 DIAGNOSIS — D126 Benign neoplasm of colon, unspecified: Secondary | ICD-10-CM | POA: Diagnosis not present

## 2024-04-06 DIAGNOSIS — Z1331 Encounter for screening for depression: Secondary | ICD-10-CM | POA: Diagnosis not present

## 2024-04-06 DIAGNOSIS — H8190 Unspecified disorder of vestibular function, unspecified ear: Secondary | ICD-10-CM | POA: Diagnosis not present

## 2024-04-06 DIAGNOSIS — Z1339 Encounter for screening examination for other mental health and behavioral disorders: Secondary | ICD-10-CM | POA: Diagnosis not present

## 2024-05-10 DIAGNOSIS — Z794 Long term (current) use of insulin: Secondary | ICD-10-CM | POA: Diagnosis not present

## 2024-05-10 DIAGNOSIS — E785 Hyperlipidemia, unspecified: Secondary | ICD-10-CM | POA: Diagnosis not present

## 2024-05-10 DIAGNOSIS — K760 Fatty (change of) liver, not elsewhere classified: Secondary | ICD-10-CM | POA: Diagnosis not present

## 2024-05-10 DIAGNOSIS — I10 Essential (primary) hypertension: Secondary | ICD-10-CM | POA: Diagnosis not present

## 2024-05-10 DIAGNOSIS — E139 Other specified diabetes mellitus without complications: Secondary | ICD-10-CM | POA: Diagnosis not present

## 2024-05-10 DIAGNOSIS — Z4681 Encounter for fitting and adjustment of insulin pump: Secondary | ICD-10-CM | POA: Diagnosis not present

## 2024-05-10 DIAGNOSIS — E10319 Type 1 diabetes mellitus with unspecified diabetic retinopathy without macular edema: Secondary | ICD-10-CM | POA: Diagnosis not present

## 2024-05-22 ENCOUNTER — Other Ambulatory Visit: Payer: Self-pay | Admitting: Obstetrics & Gynecology

## 2024-05-22 DIAGNOSIS — Z1231 Encounter for screening mammogram for malignant neoplasm of breast: Secondary | ICD-10-CM

## 2024-05-23 NOTE — Patient Instructions (Signed)

## 2024-05-23 NOTE — Progress Notes (Unsigned)
 PATIENT: Kelly Glover DOB: 1951-02-24  REASON FOR VISIT: follow up HISTORY FROM: patient  No chief complaint on file.    HISTORY OF PRESENT ILLNESS:  05/23/24 ALL:  Leslie returns for follow up for OSA on CPAP.     05/25/2023 ALL:  Kelly Glover is a 73 y.o. female here today for follow up for OSA on CPAP. She was last seen by Dr Chalice 04/2022 and struggling to meet compliance recommendations due to chronic cough. Since, she reports doing fairly well. She continues to have lower compliance data due to traveling. She tolerates therapy well. She may have a little congestion and dry eye on nights she uses CPAP. Sheis using a nasal mask and would like to try a nasal pillow. She does not like to pack CPAP when traveling. Travel CPAP is too expensive. 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 She also denies any further syncopal episodes on therapy.     HISTORY: (copied from Dr Dohmeier's previous note)  Kelly Glover is a 73 y.o.  Caucasian female patient seen here in a RV on   04-22-2022:  The patient still reports less headaches, her eye condition improved as confirmed by Dr. Elner. She reports a frequent coughing, and she developed a cough in the last 3 months, COVID testing repeatedly was negative. Has a mast cell disorder- autoimmune challenge.  Today's compliance download for 11 hours shows that she has used the machine from mid June to July for 40% of the time with an average of 5 hours and 23 minutes on days used, but the 90-day review shows a compliance of 46 out of 90 days 51% with an average of 5 hours and 36 minutes on days used.  She stated she traveled a lot and did not always take the machine with her.  She also has reported this cough that does bother her and it is hard to cough by wearing CPAP.  The mean pressure at the 95th percentile is between 8.7 and 8.5 cm water  and the average AHI is  between 2.2 and 2.7/h.  No significant central apnea arising.  Average leak is low 4.7 L/min.  And was a little bit higher just the last 30 days.   Medication has not drastically changed, the Epworth sleepiness score was endorsed at 4/ 24  points, the fatigue severity scale was not requested today.  2022:  Patient has used her new CPAP with some effort but also with success, and she made an interesting discovery- she used a pulse-oximetry device and fell asleep without CPAP in place,then put CPAP on at 1.45 AM and the oxygen levels were clearly better and less variable , her pulse rate was slower.  She is continuing CPA because it should improve her eye condition. The Compliance record is excellent. The  couple has been on a Cruise, a wedding in [place] and contracted Covid 19. She recovered well from flu like symptoms. Treated with Paxlovid- She would like to purchase a travel CPAP- Epworth 3 points !   Much improved.  LUNA machine - she assured me she used it 28 nights but this is not recorded: low compliance on this months record.  5-12 cm water  range, 14/ 24 days- average use 4.30 minutes.  95% pressure of 8.3 cm water , which would be setting for her travel CPAP. Variable leaks- nasal mask, FFM- she wants to try a  nasal pillow. Bella swift ear loops. Please order and fit.   Consult note 2022.  Kelly Glover is here today for a follow-up she underwent a split-night polysomnography study on December 04, 2020 which revealed apnea to be present this was an mild apnea category with an AHI of 15.8 and this to have much higher REM sleep AHI than non-REM sleep AHI.  Supine sleep also accentuated the apnea hypopnea index 25.9/h versus a nonsupine apnea-hypopnea index of 10.3.  There were prolonged periods of hypoxemia hypoxia at nadir was 69% saturation.  So the treatment for this constellation is usually a positive airway pressure therapy the patient started on CPAP. Dr Elner, ophthalmologist-the mean pressure usage  is 7.1 cmH2O the average 95th percentile pressure is 9.1 cmH2O these are still mild pressures required to overcome this patient's apnea.  She has very minimal air leakage on average 2 minutes she has an average leak of 7.5 L/min that is mild as well and her residual apnea hypopnea index is now 2.4 which is a good resolution of sleep apnea.  She has used the machine 30 out of 30 days the total usage and date range was 5 hours 32 minutes on average this fulfills the criteria of compliance by her insurance company.  Ramp pressure is 4 cmH2O minimum pressure maintained is 5 and maximum pressure is 12 cmH2O.  She has access to oximetry and this documented resolution of hypoxemia. This will please dr Elner as well  Epworth score: 2/24  No more morning headache ! ROS: 30 sec drop attack, looking pale. Had 2 of these- not sure of cause.   Referral from Dr Elner, concerned about hypoxemia at night , measured by ring, and cystoid macular edema. .  Chief concern according to patient :  Rm 11 with husband, Dr. Okey.  Pt reports she is here to discuss sleep study per Dr. 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 She denies prior sleep study and reports she does snore at night.   I have the pleasure of seeing Kelly Glover today, a right -handed Caucasian female with a possible sleep disorder.  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   Sleep relevant medical history: Nocturia once at 4.30 AM. Family medical /sleep history: 2 sisters  suspected to have OSA, no insomnia, no sleep walkers.    Social history:  adult children , some out of state. Husband is MD> Patient is retired,  homemaker, and lives in a household with 2 persons, 3 grandchildren visit often.  Tobacco use; never.  ETOH use ; none ,  Caffeine intake in form of Coffee( all day, 2 cups  In AM one at lunch).    Sleep habits are as follows: The patient's dinner time is between 5-7 PM. The patient goes to bed at 10.30 PM and continues to sleep for 5 hours, wakes for one bathroom breaks.  She likes the bedroom cool, quiet and dark.   The preferred sleep position is side , with the support of 2 pillows.  Dreams are reportedly infrequent/vivid.  7 AM is the usual rise time. The patient wakes up spontaneously.   She reports  feeling refreshed and restored in AM, lately she  had headaches in AM with symptoms such as dry mouth, morning headaches. No Naps are taken.Given the documented hypoxemia I suspect that the patient will have a REM dependent sleep apnea.  The longest periods of oxygen desaturation all of her in the early morning hours and are not related to her usual bathroom break time.  Follow while there is some motion artifact around 430 there are prolonged hypoxemia.  Already at 230 and also is late at 6 AM.  And this has been repeatedly demonstrated throughout the night.  Her pulse range does not respond as nimble to these oxygen desaturations.  She has been taking metoprolol as needed and not on a regular basis and this could affect the response of this of her heart rate.  The ring will not allow me to see if she has arrhythmia only a heart rate variability can be demonstrated and her pulse rate at the highest was only 78 bpm.  The lowest oxygen saturation at nadir 77%.  I agree with Dr. Elner that an evaluation and treatment for sleep apnea should be performed given that this may reverse some of the macular edema especially in the left eye.     REVIEW OF SYSTEMS: Out of a complete 14 system review of symptoms, the patient complains only of the following symptoms, dry eyes, congestion and all other reviewed  systems are negative.  ESS: 3/24  ALLERGIES: Allergies  Allergen Reactions   Diflucan [Fluconazole]     Itching, palms red, throat itchy.    Shellfish Allergy Itching    Redness, throat constriction   Doxycycline Hyclate Other (See Comments)   Percocet [Oxycodone -Acetaminophen ] Other (See Comments)    Diaphoresis, nausea, confusion.    Shellfish-Derived Products Itching   Doxycycline Itching and Rash    Itchy throat   Griseofulvin Rash    HOME MEDICATIONS: Outpatient Medications Prior to Visit  Medication Sig Dispense Refill   aspirin EC 81 MG tablet Take 81 mg by mouth daily.     cetirizine (ZYRTEC) 10 MG tablet Take 10 mg by mouth daily.     Cholecalciferol (VITAMIN D ) 50 MCG (2000 UT) CAPS Take 2 capsules (4,000 Units total) by mouth daily. 30 capsule    EPINEPHrine  (EPIPEN  2-PAK) 0.3 mg/0.3 mL IJ SOAJ injection as directed Injection as needed for severe allergic reaction for 5 days (Patient not taking: Reported on 09/30/2022)     ezetimibe (ZETIA) 10 MG tablet Take 10 mg by mouth daily.  0   famotidine (PEPCID) 10 MG tablet Chew 10 mg by mouth daily.      FOLIC ACID PO Take 400 mcg by mouth daily.      Glucagon (GVOKE HYPOPEN 2-PACK) 1 MG/0.2ML SOAJ as directed Subcutaneous for severe hypoglycemia     JARDIANCE 25 MG TABS tablet Take 25 mg by mouth daily.     omeprazole-sodium bicarbonate (ZEGERID) 40-1100 MG per capsule Take 1 capsule by mouth daily before breakfast.     ONE TOUCH ULTRA TEST test strip      ramipril (ALTACE) 2.5 MG capsule Take 1 capsule by mouth daily. Pt take 10 mg a day     rosuvastatin (CRESTOR) 40 MG tablet Take 40 mg by mouth daily.      sertraline (ZOLOFT) 100 MG tablet Take 50 mg by mouth daily.      No facility-administered medications prior to visit.    PAST MEDICAL HISTORY: Past Medical History:  Diagnosis Date   Allergy    SEASONAL   Aortic  atherosclerosis (HCC)    Arthritis    Cataract    BILATERAL,REMOVED   Colon polyps     Diabetes (HCC)    Diverticulitis    Diverticulosis    DM (diabetes mellitus) (HCC)    GERD (gastroesophageal reflux disease)    Hepatic steatosis    History of diverticulitis    History of kidney stones    Hyperlipidemia    Kidney stone    Mast cell disorder    inappropriate mast cell activation   Nephrolithiasis    Neuromuscular disorder (HCC)    inappropriate mast cell response disorder    Obesity    PONV (postoperative nausea and vomiting)    Sleep apnea    Squamous cell carcinoma of left lower leg 10/05/2013    PAST SURGICAL HISTORY: Past Surgical History:  Procedure Laterality Date   BLADDER SURGERY     BREAST BIOPSY Left ?   benign   CATARACTS     COLONOSCOPY  2018   DIAGNOSTIC LAPAROSCOPY     RECTOCELE REPAIR N/A 09/17/2014   Procedure: POSTERIOR REPAIR , enterocele repair;  Surgeon: Bobie FORBES Crown de Charlynn FORBES Cary, MD;  Location: [facility];  Service: Gynecology;  Laterality: N/A;   SALPINGOOPHORECTOMY     TONSILLECTOMY  childhood   TOTAL ABDOMINAL HYSTERECTOMY W/ BILATERAL SALPINGOOPHORECTOMY  05/20/1998    FAMILY HISTORY: Family History  Problem Relation Age of Onset   Diabetes Mother    Heart disease Mother    Heart disease Father    Colon polyps Father    Thyroid  disease Sister    Colon polyps Sister    Rectal cancer Paternal Aunt    Asthma Maternal Grandmother 43   Diabetes Maternal Grandmother    Breast cancer Maternal Grandmother    Diabetes Maternal Grandfather    Graves' disease Son    Thyroid  disease Son    Stomach cancer Neg Hx    Esophageal cancer Neg Hx    Colon cancer Neg Hx    Crohn's disease Neg Hx    Ulcerative colitis Neg Hx     SOCIAL HISTORY: Social History   Socioeconomic History   Marital status: Married    Spouse name: Not on file   Number of children: 2   Years of education: Not on file   Highest education level: Not on file  Occupational History   Occupation: retired   Occupation: Arts development officer  Tobacco Use    Smoking status: Never    Passive exposure: Past (PARENTS SMOKED)   Smokeless tobacco: Never  Vaping Use   Vaping status: Never Used  Substance and Sexual Activity   Alcohol  use: Not Currently    Comment: rare   Drug use: No   Sexual activity: Yes    Partners: Male    Birth control/protection: Surgical    Comment: hysterectomy  Other Topics Concern   Not on file  Social History Narrative   Not on file   Social Drivers of Health   Financial Resource Strain: Not on file  Food Insecurity: Not on file  Transportation Needs: Not on file  Physical Activity: Not on file  Stress: Not on file  Social Connections: Not on file  Intimate Partner Violence: Not on file     PHYSICAL EXAM  There were no vitals filed for this visit.  There is no height or weight on file to calculate BMI.  Generalized: Well developed, in no acute distress  Cardiology: normal rate and rhythm, no murmur noted Respiratory:  clear to auscultation bilaterally  Neurological examination  Mentation: Alert oriented to time, place, history taking. Follows all commands speech and language fluent Cranial nerve II-XII: Pupils were equal round reactive to light. Extraocular movements were full, visual field were full  Motor: The motor testing reveals 5 over 5 strength of all 4 extremities. Good symmetric motor tone is noted throughout.  Gait and station: Gait is normal.    DIAGNOSTIC DATA (LABS, IMAGING, TESTING) - I reviewed patient records, labs, notes, testing and imaging myself where available.      No data to display           Lab Results  Component Value Date   WBC 14.4 (H) 06/26/2017   HGB 14.7 06/26/2017   HCT 43.2 06/26/2017   MCV 90.2 06/26/2017   PLT 225 06/26/2017      Component Value Date/Time   NA 137 06/26/2017 1531   K 4.2 06/26/2017 1531   CL 102 06/26/2017 1531   CO2 25 06/26/2017 1531   GLUCOSE 171 (H) 06/26/2017 1531   BUN 17 06/26/2017 1531   CREATININE 0.74 06/26/2017 1531    CALCIUM 9.7 06/26/2017 1531   PROT 7.7 06/26/2017 1531   ALBUMIN 4.2 06/26/2017 1531   AST 18 06/26/2017 1531   ALT 26 06/26/2017 1531   ALKPHOS 75 06/26/2017 1531   BILITOT 0.7 06/26/2017 1531   GFRNONAA >60 06/26/2017 1531   GFRAA >60 06/26/2017 1531   No results found for: CHOL, HDL, LDLCALC, LDLDIRECT, TRIG, CHOLHDL No results found for: YHAJ8R No results found for: VITAMINB12 No results found for: TSH   ASSESSMENT AND PLAN 73 y.o. year old female  has a past medical history of Allergy, Aortic atherosclerosis (HCC), Arthritis, Cataract, Colon polyps, Diabetes (HCC), Diverticulitis, Diverticulosis, DM (diabetes mellitus) (HCC), GERD (gastroesophageal reflux disease), Hepatic steatosis, History of diverticulitis, History of kidney stones, Hyperlipidemia, Kidney stone, Mast cell disorder, Nephrolithiasis, Neuromuscular disorder (HCC), Obesity, PONV (postoperative nausea and vomiting), Sleep apnea, and Squamous cell carcinoma of left lower leg (10/05/2013). here with   No diagnosis found.    KEELA RUBERT is doing well on CPAP therapy. Compliance report reveals suboptimal use. She was encouraged to continue using CPAP nightly and for greater than 4 hours each night. I will recheck compliance in 90 days as travel should be less at that time. We will update supply orders as indicated. I have advised she try nasal pillow. We reviewed previous sleep study. Risks of untreated sleep apnea review and education materials provided. Consider travel machine. Healthy lifestyle habits encouraged. She will follow up in 1 year, sooner if needed. She verbalizes understanding and agreement with this plan.    No orders of the defined types were placed in this encounter.    No orders of the defined types were placed in this encounter.     Greig Forbes, FNP-C 05/23/2024, 4:44 PM [facility] [address], [address] [place], [place] [address] [phone]

## 2024-05-24 ENCOUNTER — Ambulatory Visit (INDEPENDENT_AMBULATORY_CARE_PROVIDER_SITE_OTHER): Payer: Medicare Other | Admitting: Family Medicine

## 2024-05-24 ENCOUNTER — Encounter: Payer: Self-pay | Admitting: Family Medicine

## 2024-05-24 VITALS — BP 122/78 | HR 66 | Ht 66.0 in

## 2024-05-24 DIAGNOSIS — G4733 Obstructive sleep apnea (adult) (pediatric): Secondary | ICD-10-CM | POA: Diagnosis not present

## 2024-05-24 DIAGNOSIS — H35352 Cystoid macular degeneration, left eye: Secondary | ICD-10-CM | POA: Diagnosis not present

## 2024-05-24 DIAGNOSIS — E113391 Type 2 diabetes mellitus with moderate nonproliferative diabetic retinopathy without macular edema, right eye: Secondary | ICD-10-CM | POA: Diagnosis not present

## 2024-05-24 DIAGNOSIS — E113392 Type 2 diabetes mellitus with moderate nonproliferative diabetic retinopathy without macular edema, left eye: Secondary | ICD-10-CM | POA: Diagnosis not present

## 2024-05-24 DIAGNOSIS — H35072 Retinal telangiectasis, left eye: Secondary | ICD-10-CM | POA: Diagnosis not present

## 2024-05-24 DIAGNOSIS — H43822 Vitreomacular adhesion, left eye: Secondary | ICD-10-CM | POA: Diagnosis not present

## 2024-05-24 DIAGNOSIS — Z794 Long term (current) use of insulin: Secondary | ICD-10-CM | POA: Diagnosis not present

## 2024-05-24 NOTE — Progress Notes (Signed)
 Sent community message to [employer] that supply order placed.

## 2024-05-25 ENCOUNTER — Ambulatory Visit
Admission: RE | Admit: 2024-05-25 | Discharge: 2024-05-25 | Disposition: A | Discharge status: Home / Self Care | Source: Ambulatory Visit | Attending: Endocrinology | Admitting: Endocrinology

## 2024-05-25 DIAGNOSIS — K760 Fatty (change of) liver, not elsewhere classified: Secondary | ICD-10-CM

## 2024-05-29 DIAGNOSIS — Z1231 Encounter for screening mammogram for malignant neoplasm of breast: Secondary | ICD-10-CM | POA: Diagnosis not present

## 2024-06-02 DIAGNOSIS — D485 Neoplasm of uncertain behavior of skin: Secondary | ICD-10-CM | POA: Diagnosis not present

## 2024-06-02 DIAGNOSIS — L820 Inflamed seborrheic keratosis: Secondary | ICD-10-CM | POA: Diagnosis not present

## 2024-06-02 DIAGNOSIS — L570 Actinic keratosis: Secondary | ICD-10-CM | POA: Diagnosis not present

## 2024-06-02 DIAGNOSIS — L309 Dermatitis, unspecified: Secondary | ICD-10-CM | POA: Diagnosis not present

## 2024-06-02 DIAGNOSIS — B078 Other viral warts: Secondary | ICD-10-CM | POA: Diagnosis not present

## 2024-06-12 DIAGNOSIS — Z7189 Other specified counseling: Secondary | ICD-10-CM | POA: Diagnosis not present

## 2024-06-12 DIAGNOSIS — K760 Fatty (change of) liver, not elsewhere classified: Secondary | ICD-10-CM | POA: Diagnosis not present

## 2024-06-12 DIAGNOSIS — Z794 Long term (current) use of insulin: Secondary | ICD-10-CM | POA: Diagnosis not present

## 2024-06-12 DIAGNOSIS — E139 Other specified diabetes mellitus without complications: Secondary | ICD-10-CM | POA: Diagnosis not present

## 2024-06-12 DIAGNOSIS — E785 Hyperlipidemia, unspecified: Secondary | ICD-10-CM | POA: Diagnosis not present

## 2024-06-12 DIAGNOSIS — E10319 Type 1 diabetes mellitus with unspecified diabetic retinopathy without macular edema: Secondary | ICD-10-CM | POA: Diagnosis not present

## 2024-06-12 DIAGNOSIS — I10 Essential (primary) hypertension: Secondary | ICD-10-CM | POA: Diagnosis not present

## 2024-06-12 DIAGNOSIS — Z4681 Encounter for fitting and adjustment of insulin pump: Secondary | ICD-10-CM | POA: Diagnosis not present

## 2024-06-13 DIAGNOSIS — Z961 Presence of intraocular lens: Secondary | ICD-10-CM | POA: Diagnosis not present

## 2024-06-13 DIAGNOSIS — E119 Type 2 diabetes mellitus without complications: Secondary | ICD-10-CM | POA: Diagnosis not present

## 2024-06-15 ENCOUNTER — Ambulatory Visit

## 2024-06-15 DIAGNOSIS — Z23 Encounter for immunization: Secondary | ICD-10-CM | POA: Diagnosis not present

## 2024-07-07 DIAGNOSIS — Z23 Encounter for immunization: Secondary | ICD-10-CM | POA: Diagnosis not present

## 2024-09-17 NOTE — Progress Notes (Unsigned)
 Cardiology Office Note   Date:  09/18/2024   ID:  Kelly Glover 04-27-1951, MRN [id number]  PCP:  Nichole Senior, MD  Cardiologist:   Vina Okey, MD    F/U of HL and atherosclerosis     History of Present Illness: Kelly Glover is a 73 y.o. female with a history of Type I DM, atherosclerosis of aorta, HL, dizziness, palpitations, OSA (on CPAP)    2013  Myoview showed no ischemia     I saw the pt in 2023  The pt says that she has been getting intermittent episodes of chest discomfort (substernal, radiating to L chest, under L breast)   Occur couple times per week  Not pleuritic   Not while sleeping   Difficult to track when occurs   Not like reflux     Breathing is overall OK       Current Meds  Medication Sig   aspirin EC 81 MG tablet Take 81 mg by mouth daily.   cetirizine (ZYRTEC) 10 MG tablet Take 10 mg by mouth daily.   Cholecalciferol (VITAMIN D ) 50 MCG (2000 UT) CAPS Take 2 capsules (4,000 Units total) by mouth daily.   EPINEPHrine  (EPIPEN  2-PAK) 0.3 mg/0.3 mL IJ SOAJ injection as directed Injection as needed for severe allergic reaction for 5 days   ezetimibe (ZETIA) 10 MG tablet Take 10 mg by mouth daily.   famotidine (PEPCID) 10 MG tablet Chew 10 mg by mouth daily.    FOLIC ACID PO Take 400 mcg by mouth daily.    Glucagon (GVOKE HYPOPEN 2-PACK) 1 MG/0.2ML SOAJ as directed Subcutaneous for severe hypoglycemia   Insulin  Human (INSULIN  PUMP) SOLN Inject into the skin.   JARDIANCE 25 MG TABS tablet Take 25 mg by mouth daily.   omeprazole-sodium bicarbonate (ZEGERID) 40-1100 MG per capsule Take 1 capsule by mouth daily before breakfast.   ONE TOUCH ULTRA TEST test strip    ramipril (ALTACE) 2.5 MG capsule Take 1 capsule by mouth daily. Pt take 10 mg a day   rosuvastatin (CRESTOR) 40 MG tablet Take 40 mg by mouth daily.    sertraline (ZOLOFT) 100 MG tablet Take 50 mg by mouth daily.      Allergies:   Diflucan [fluconazole], Shellfish allergy, Doxycycline hyclate,  Percocet [oxycodone -acetaminophen ], Shellfish protein-containing drug products, Doxycycline, and Griseofulvin   Past Medical History:  Diagnosis Date   Allergy    SEASONAL   Aortic atherosclerosis    Arthritis    Cataract    BILATERAL,REMOVED   Colon polyps    Diabetes (HCC)    Diverticulitis    Diverticulosis    DM (diabetes mellitus) (HCC)    GERD (gastroesophageal reflux disease)    Hepatic steatosis    History of diverticulitis    History of kidney stones    Hyperlipidemia    Kidney stone    Mast cell disorder    inappropriate mast cell activation   Nephrolithiasis    Neuromuscular disorder (HCC)    inappropriate mast cell response disorder    Obesity    PONV (postoperative nausea and vomiting)    Sleep apnea    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   benign   CATARACTS     COLONOSCOPY  2018   DIAGNOSTIC LAPAROSCOPY     RECTOCELE REPAIR N/A 09/17/2014   Procedure: POSTERIOR  REPAIR , enterocele repair;  Surgeon: Bobie FORBES Crown de Charlynn FORBES Cary, MD;  Location: [facility];  Service: Gynecology;  Laterality: N/A;   SALPINGOOPHORECTOMY     TONSILLECTOMY  childhood   TOTAL ABDOMINAL HYSTERECTOMY W/ BILATERAL SALPINGOOPHORECTOMY  05/20/1998     Social History:  The patient  reports that she has never smoked. She has been exposed to tobacco smoke. She has never used smokeless tobacco. She reports that she does not currently use alcohol . She reports that she does not use drugs.   Family History:  The patient's family history includes Asthma (age of onset: 29) in her maternal grandmother; Breast cancer in her maternal grandmother; Colon polyps in her father and sister; Diabetes in her maternal grandfather, maternal grandmother, and mother; Yvone' disease in her son; Heart disease in her father and mother; Rectal cancer in her paternal aunt; Thyroid  disease in  her sister and son.    ROS:  Please see the history of present illness. All other systems are reviewed and  Negative to the above problem except as noted.    PHYSICAL EXAM: VS:  BP (!) 160/70 (BP Location: Left Arm, Patient Position: Sitting, Cuff Size: Normal)   Pulse 69   Ht 5' 6 (1.676 m)   Wt 175 lb (79.4 kg)   SpO2 93%   BMI 28.25 kg/m   GEN: Overweight 73 yo  in no acute distress  HEENT: normal  Neck: no JVD, no  carotid bruits Cardiac: RRR; no murmur, Respiratory:  clear to auscultation GI: soft, nontender, No hepatomegaly  Ext   No LE edema    EKG:  EKG is ordered today.  SR 69 bpm   LVH by voltage    Lipid Panel No results found for: CHOL, TRIG, HDL, CHOLHDL, VLDL, LDLCALC, LDLDIRECT    Wt Readings from Last 3 Encounters:  09/18/24 175 lb (79.4 kg)  10/12/22 170 lb (77.1 kg)  09/30/22 170 lb (77.1 kg)      ASSESSMENT AND PLAN:  1Chest pain   No completely typical   No clear association with an activity    Does not appear to be GERD  She has a long hx of diabetes with retinopathy Would recomm coronary CTA to evaluate   2  Aortic atherosclerosis     Continue risk factor modification  3   HL   Will get NMR panel  4  HTN  BP is high today  Usually better   I have asked pt to follow at home  Call if remains high   SHe says it is usually better   4  DM  Long hx Diabetic retinopathy  Has insulin  pump   A1C was 7.2   Follows with [facility]   On insulin  and jardiance No ozempic given hx of pancreatitis in past   5  OSA    Uses CPAP     Follow based on test results  Tentative in 9 months     Current medicines are reviewed at length with the patient today.  The patient does not have concerns regarding medicines.  Signed, Vina Gull, MD  09/18/2024 1:51 PM

## 2024-09-18 ENCOUNTER — Ambulatory Visit: Attending: Internal Medicine | Admitting: Internal Medicine

## 2024-09-18 VITALS — BP 160/70 | HR 69 | Ht 66.0 in | Wt 175.0 lb

## 2024-09-18 DIAGNOSIS — G4733 Obstructive sleep apnea (adult) (pediatric): Secondary | ICD-10-CM

## 2024-09-18 DIAGNOSIS — I10 Essential (primary) hypertension: Secondary | ICD-10-CM | POA: Insufficient documentation

## 2024-09-18 DIAGNOSIS — R079 Chest pain, unspecified: Secondary | ICD-10-CM | POA: Diagnosis present

## 2024-09-18 DIAGNOSIS — E785 Hyperlipidemia, unspecified: Secondary | ICD-10-CM | POA: Diagnosis present

## 2024-09-18 DIAGNOSIS — R072 Precordial pain: Secondary | ICD-10-CM | POA: Diagnosis present

## 2024-09-18 MED ORDER — METOPROLOL TARTRATE 100 MG PO TABS: ORAL_TABLET | ORAL | 0 refills | Status: AC

## 2024-09-18 NOTE — Patient Instructions (Signed)
 Medication Instructions:  Your physician recommends that you continue on your current medications as directed. Please refer to the Current Medication list given to you today.  *If you need a refill on your cardiac medications before your next appointment, please call your pharmacy*  Lab Work: Nmr, cbc, bmet If you have labs (blood work) drawn today and your tests are completely normal, you will receive your results only by: MyChart Message (if you have MyChart) OR A paper copy in the mail If you have any lab test that is abnormal or we need to change your treatment, we will call you to review the results.  Testing/Procedures:   Your cardiac CT will be scheduled at one of the below locations:   [facility] [address] [place], [place] [address] [phone] (Severe contrast allergies only)  OR   [facility] [address]  [place], [place] [address]   If scheduled at [facility], please arrive at the [facility] Entr[facility] (Entrance C2) of [facility] 30 minutes prior to test start time. You can use the FREE valet parking offered at entrance C (encouraged to control the heart rate for the test)  Proceed to the [facility] Radiology Department (first floor) to check-in and test prep.  All radiology patients and guests should use entrance C2 at [facility], accessed from [facility], even though the hospital's physical address listed is [address].  If scheduled at the Heart and Vascular Tower at [address], please enter the parking lot using the [address] entrance and use the FREE valet service at the patient drop-off area. Enter the building and check-in with registration on the main floor.  If scheduled at [facility], please arrive to the Heart and Vascular Center 15 mins early for check-in and test prep.  There is spacious parking and easy  access to the radiology department from the [facility] and Vascular entrance. Please enter here and check-in with the desk attendant.   If scheduled at [facility], please arrive 30 minutes early for check-in and test prep.  Please follow these instructions carefully (unless otherwise directed):  An IV will be required for this test and Nitroglycerin will be given.  Hold all erectile dysfunction medications at least 3 days (72 hrs) prior to test. (Ie viagra, cialis, sildenafil, tadalafil, etc)   On the Night Before the Test: Be sure to Drink plenty of water . Do not consume any caffeinated/decaffeinated beverages or chocolate 12 hours prior to your test. Do not take any antihistamines 12 hours prior to your test.  If the patient has contrast allergy: Patient will need a prescription for Prednisone and very clear instructions (as follows): Prednisone 50 mg - take 13 hours prior to test Take another Prednisone 50 mg 7 hours prior to test Take another Prednisone 50 mg 1 hour prior to test Take Benadryl 50 mg 1 hour prior to test Patient must complete all four doses of above prophylactic medications. Patient will need a ride after test due to Benadryl.  On the Day of the Test: Drink plenty of water  until 1 hour prior to the test. Do not eat any food 1 hour prior to test. You may take your regular medications prior to the test.  Take metoprolol  (Lopressor ) 100mg  two hours prior to test. If you take Furosemide/Hydrochlorothiazide/Spironolactone/Chlorthalidone, please HOLD on the morning of the test. Patients who wear a continuous glucose monitor  MUST remove the device prior to scanning. FEMALES- please wear underwire-free bra if available, avoid dresses & tight clothing  After the Test: Drink plenty of water . After receiving IV contrast, you may experience a mild flushed feeling. This is normal. On occasion, you may experience a mild rash up to 24 hours after the test. This is  not dangerous. If this occurs, you can take Benadryl 25 mg, Zyrtec, Claritin, or Allegra and increase your fluid intake. (Patients taking Tikosyn should avoid Benadryl, and may take Zyrtec, Claritin, or Allegra) If you experience trouble breathing, this can be serious. If it is severe call 911 IMMEDIATELY. If it is mild, please call our office.  We will call to schedule your test 2-4 weeks out understanding that some insurance companies will need an authorization prior to the service being performed.   For more information and frequently asked questions, please visit our website : [email or web address]  For non-scheduling related questions, please contact the cardiac imaging nurse navigator should you have any questions/concerns: Cardiac Imaging Nurse Navigators Direct Office Dial: [phone]   For scheduling needs, including cancellations and rescheduling, please call Brittany, [phone].   Follow-Up: At [facility], you and your health needs are our priority.  As part of our continuing mission to provide you with exceptional heart care, our providers are all part of one team.  This team includes your primary Cardiologist (physician) and Advanced Practice Providers or APPs (Physician Assistants and Nurse Practitioners) who all work together to provide you with the care you need, when you need it.  Your next appointment:   9 months  Provider:   Dr. Okey  We recommend signing up for the patient portal called MyChart.  Sign up information is provided on this After Visit Summary.  MyChart is used to connect with patients for Virtual Visits (Telemedicine).  Patients are able to view lab/test results, encounter notes, upcoming appointments, etc.  Non-urgent messages can be sent to your provider as well.   To learn more about what you can do with MyChart, go to [email or web address].   Other Instructions none

## 2024-09-19 ENCOUNTER — Ambulatory Visit: Payer: Self-pay | Admitting: Internal Medicine

## 2024-09-19 LAB — CBC
Hematocrit: 44.7 % (ref 34.0–46.6)
Hemoglobin: 14.1 g/dL (ref 11.1–15.9)
MCH: 30.7 pg (ref 26.6–33.0)
MCHC: 31.5 g/dL (ref 31.5–35.7)
MCV: 97 fL (ref 79–97)
Platelets: 242 x10E3/uL (ref 150–450)
RBC: 4.59 x10E6/uL (ref 3.77–5.28)
RDW: 12.2 % (ref 11.7–15.4)
WBC: 7.9 x10E3/uL (ref 3.4–10.8)

## 2024-09-19 LAB — COMPREHENSIVE METABOLIC PANEL WITH GFR
ALT: 37 IU/L — ABNORMAL HIGH (ref 0–32)
AST: 24 IU/L (ref 0–40)
Albumin: 4.5 g/dL (ref 3.8–4.8)
Alkaline Phosphatase: 69 IU/L (ref 49–135)
BUN/Creatinine Ratio: 20 (ref 12–28)
BUN: 16 mg/dL (ref 8–27)
Bilirubin Total: 0.3 mg/dL (ref 0.0–1.2)
CO2: 24 mmol/L (ref 20–29)
Calcium: 9.9 mg/dL (ref 8.7–10.3)
Chloride: 106 mmol/L (ref 96–106)
Creatinine, Ser: 0.81 mg/dL (ref 0.57–1.00)
Globulin, Total: 2.3 g/dL (ref 1.5–4.5)
Glucose: 145 mg/dL — ABNORMAL HIGH (ref 70–99)
Potassium: 4.4 mmol/L (ref 3.5–5.2)
Sodium: 144 mmol/L (ref 134–144)
Total Protein: 6.8 g/dL (ref 6.0–8.5)
eGFR: 76 mL/min/1.73 (ref >59)

## 2024-09-19 LAB — NMR, LIPOPROFILE
Cholesterol, Total: 150 mg/dL (ref 100–199)
HDL Particle Number: 47.9 umol/L (ref >=30.5)
HDL-C: 71 mg/dL (ref >39)
LDL Particle Number: 731 nmol/L (ref <1000)
LDL Size: 19.8 nm — ABNORMAL LOW (ref >20.5)
LDL-C (NIH Calc): 63 mg/dL (ref 0–99)
LP-IR Score: <25 (ref <=45)
Small LDL Particle Number: 534 nmol/L — ABNORMAL HIGH (ref <=527)
Triglycerides: 83 mg/dL (ref 0–149)

## 2024-10-03 ENCOUNTER — Encounter (HOSPITAL_COMMUNITY): Payer: Self-pay

## 2024-10-06 ENCOUNTER — Ambulatory Visit (HOSPITAL_COMMUNITY)
Admission: RE | Admit: 2024-10-06 | Discharge: 2024-10-06 | Disposition: A | Discharge status: Home / Self Care | Source: Ambulatory Visit | Attending: Internal Medicine | Admitting: Internal Medicine

## 2024-10-06 DIAGNOSIS — R072 Precordial pain: Secondary | ICD-10-CM | POA: Insufficient documentation

## 2024-10-06 DIAGNOSIS — R079 Chest pain, unspecified: Secondary | ICD-10-CM | POA: Diagnosis not present

## 2024-10-06 MED ORDER — IOHEXOL 350 MG/ML SOLN
100.0000 mL | Freq: Once | INTRAVENOUS | Status: AC | PRN
  Administered 2024-10-06: 100 mL via INTRAVENOUS

## 2024-10-06 MED ORDER — NITROGLYCERIN 0.4 MG SL SUBL
0.8000 mg | SUBLINGUAL_TABLET | Freq: Once | SUBLINGUAL | Status: AC
  Administered 2024-10-06: 0.8 mg via SUBLINGUAL

## 2024-10-10 ENCOUNTER — Encounter: Payer: Self-pay | Admitting: Internal Medicine

## 2024-10-10 MED ORDER — AMLODIPINE BESYLATE 2.5 MG PO TABS: 2.5000 mg | ORAL_TABLET | Freq: Every day | ORAL | 3 refills | Status: AC

## 2025-05-22 ENCOUNTER — Ambulatory Visit: Admitting: Family Medicine

## 2025-05-24 ENCOUNTER — Ambulatory Visit: Admitting: Family Medicine
